# Patient Record
Sex: Female | Born: 1975 | Race: Black or African American | Hispanic: No | Marital: Single | Smoking: Never smoker
Health system: Southern US, Community
[De-identification: ages and names within clinical notes are randomized; demographics above are authoritative.]

## PROBLEM LIST (undated history)

## (undated) ENCOUNTER — Inpatient Hospital Stay (HOSPITAL_COMMUNITY): Payer: Self-pay

## (undated) DIAGNOSIS — I10 Essential (primary) hypertension: Secondary | ICD-10-CM

## (undated) DIAGNOSIS — G4700 Insomnia, unspecified: Secondary | ICD-10-CM

## (undated) DIAGNOSIS — T8859XA Other complications of anesthesia, initial encounter: Secondary | ICD-10-CM

## (undated) DIAGNOSIS — D219 Benign neoplasm of connective and other soft tissue, unspecified: Secondary | ICD-10-CM

## (undated) DIAGNOSIS — B999 Unspecified infectious disease: Secondary | ICD-10-CM

## (undated) DIAGNOSIS — T4145XA Adverse effect of unspecified anesthetic, initial encounter: Secondary | ICD-10-CM

---

## 1997-04-25 ENCOUNTER — Inpatient Hospital Stay (HOSPITAL_COMMUNITY): Admission: AD | Admit: 1997-04-25 | Discharge: 1997-04-25 | Payer: Self-pay | Admitting: Obstetrics & Gynecology

## 1997-06-18 ENCOUNTER — Inpatient Hospital Stay (HOSPITAL_COMMUNITY): Admission: AD | Admit: 1997-06-18 | Discharge: 1997-06-18 | Payer: Self-pay | Admitting: Obstetrics & Gynecology

## 1997-06-20 ENCOUNTER — Ambulatory Visit (HOSPITAL_COMMUNITY): Admission: RE | Admit: 1997-06-20 | Discharge: 1997-06-20 | Payer: Self-pay | Admitting: Obstetrics

## 1997-09-02 ENCOUNTER — Ambulatory Visit (HOSPITAL_COMMUNITY): Admission: RE | Admit: 1997-09-02 | Discharge: 1997-09-02 | Payer: Self-pay | Admitting: Obstetrics

## 1997-10-11 ENCOUNTER — Inpatient Hospital Stay (HOSPITAL_COMMUNITY): Admission: AD | Admit: 1997-10-11 | Discharge: 1997-10-11 | Payer: Self-pay | Admitting: *Deleted

## 1997-10-23 ENCOUNTER — Encounter: Admission: RE | Admit: 1997-10-23 | Discharge: 1998-01-21 | Payer: Self-pay | Admitting: Obstetrics & Gynecology

## 1997-10-29 ENCOUNTER — Inpatient Hospital Stay (HOSPITAL_COMMUNITY): Admission: AD | Admit: 1997-10-29 | Discharge: 1997-10-31 | Payer: Self-pay | Admitting: *Deleted

## 1997-10-31 ENCOUNTER — Inpatient Hospital Stay (HOSPITAL_COMMUNITY): Admission: AD | Admit: 1997-10-31 | Discharge: 1997-11-02 | Payer: Self-pay | Admitting: Obstetrics

## 1998-09-27 ENCOUNTER — Encounter: Payer: Self-pay | Admitting: Emergency Medicine

## 1998-09-27 ENCOUNTER — Emergency Department (HOSPITAL_COMMUNITY): Admission: EM | Admit: 1998-09-27 | Discharge: 1998-09-27 | Payer: Self-pay | Admitting: Emergency Medicine

## 1998-10-03 ENCOUNTER — Encounter: Admission: RE | Admit: 1998-10-03 | Discharge: 1998-11-10 | Payer: Self-pay | Admitting: Family Medicine

## 1999-01-27 ENCOUNTER — Emergency Department (HOSPITAL_COMMUNITY): Admission: EM | Admit: 1999-01-27 | Discharge: 1999-01-27 | Payer: Self-pay | Admitting: Emergency Medicine

## 1999-01-27 ENCOUNTER — Encounter: Payer: Self-pay | Admitting: Emergency Medicine

## 1999-05-21 ENCOUNTER — Emergency Department (HOSPITAL_COMMUNITY): Admission: EM | Admit: 1999-05-21 | Discharge: 1999-05-21 | Payer: Self-pay | Admitting: Emergency Medicine

## 2000-06-05 ENCOUNTER — Inpatient Hospital Stay (HOSPITAL_COMMUNITY): Admission: AD | Admit: 2000-06-05 | Discharge: 2000-06-05 | Payer: Self-pay | Admitting: Obstetrics & Gynecology

## 2000-10-26 ENCOUNTER — Inpatient Hospital Stay (HOSPITAL_COMMUNITY): Admission: AD | Admit: 2000-10-26 | Discharge: 2000-10-26 | Payer: Self-pay | Admitting: Obstetrics

## 2000-11-02 ENCOUNTER — Encounter: Admission: RE | Admit: 2000-11-02 | Discharge: 2000-11-02 | Payer: Self-pay | Admitting: Obstetrics & Gynecology

## 2000-11-02 ENCOUNTER — Other Ambulatory Visit: Admission: RE | Admit: 2000-11-02 | Discharge: 2000-11-02 | Payer: Self-pay | Admitting: Obstetrics & Gynecology

## 2000-11-09 ENCOUNTER — Encounter: Admission: RE | Admit: 2000-11-09 | Discharge: 2000-11-09 | Payer: Self-pay | Admitting: Obstetrics & Gynecology

## 2000-11-09 ENCOUNTER — Encounter: Admission: RE | Admit: 2000-11-09 | Discharge: 2001-02-07 | Payer: Self-pay | Admitting: Obstetrics & Gynecology

## 2000-11-11 ENCOUNTER — Ambulatory Visit (HOSPITAL_COMMUNITY): Admission: RE | Admit: 2000-11-11 | Discharge: 2000-11-11 | Payer: Self-pay | Admitting: Obstetrics & Gynecology

## 2000-11-16 ENCOUNTER — Encounter: Admission: RE | Admit: 2000-11-16 | Discharge: 2000-11-16 | Payer: Self-pay | Admitting: Obstetrics & Gynecology

## 2000-11-23 ENCOUNTER — Encounter: Admission: RE | Admit: 2000-11-23 | Discharge: 2000-11-23 | Payer: Self-pay | Admitting: Obstetrics & Gynecology

## 2000-11-30 ENCOUNTER — Encounter: Admission: RE | Admit: 2000-11-30 | Discharge: 2000-11-30 | Payer: Self-pay | Admitting: Obstetrics & Gynecology

## 2000-12-14 ENCOUNTER — Encounter: Admission: RE | Admit: 2000-12-14 | Discharge: 2000-12-14 | Payer: Self-pay | Admitting: Obstetrics & Gynecology

## 2000-12-21 ENCOUNTER — Encounter: Admission: RE | Admit: 2000-12-21 | Discharge: 2000-12-21 | Payer: Self-pay | Admitting: Obstetrics & Gynecology

## 2001-01-04 ENCOUNTER — Encounter: Admission: RE | Admit: 2001-01-04 | Discharge: 2001-01-04 | Payer: Self-pay | Admitting: Obstetrics & Gynecology

## 2001-01-09 ENCOUNTER — Ambulatory Visit (HOSPITAL_COMMUNITY): Admission: RE | Admit: 2001-01-09 | Discharge: 2001-01-09 | Payer: Self-pay | Admitting: Obstetrics & Gynecology

## 2001-01-25 HISTORY — PX: TUBAL LIGATION: SHX77

## 2001-01-25 HISTORY — PX: ENDOMETRIAL ABLATION: SHX621

## 2001-02-01 ENCOUNTER — Encounter: Admission: RE | Admit: 2001-02-01 | Discharge: 2001-02-01 | Payer: Self-pay | Admitting: Obstetrics & Gynecology

## 2001-02-15 ENCOUNTER — Encounter: Admission: RE | Admit: 2001-02-15 | Discharge: 2001-02-15 | Payer: Self-pay | Admitting: Obstetrics & Gynecology

## 2001-02-22 ENCOUNTER — Inpatient Hospital Stay (HOSPITAL_COMMUNITY): Admission: AD | Admit: 2001-02-22 | Discharge: 2001-02-22 | Payer: Self-pay | Admitting: *Deleted

## 2001-03-16 ENCOUNTER — Encounter: Admission: RE | Admit: 2001-03-16 | Discharge: 2001-03-16 | Payer: Self-pay | Admitting: *Deleted

## 2001-03-18 ENCOUNTER — Inpatient Hospital Stay (HOSPITAL_COMMUNITY): Admission: AD | Admit: 2001-03-18 | Discharge: 2001-03-18 | Payer: Self-pay | Admitting: *Deleted

## 2001-03-29 ENCOUNTER — Encounter: Admission: RE | Admit: 2001-03-29 | Discharge: 2001-03-29 | Payer: Self-pay | Admitting: *Deleted

## 2001-03-31 ENCOUNTER — Ambulatory Visit (HOSPITAL_COMMUNITY): Admission: RE | Admit: 2001-03-31 | Discharge: 2001-03-31 | Payer: Self-pay | Admitting: *Deleted

## 2001-04-06 ENCOUNTER — Encounter: Admission: RE | Admit: 2001-04-06 | Discharge: 2001-04-06 | Payer: Self-pay | Admitting: *Deleted

## 2001-04-13 ENCOUNTER — Encounter: Admission: RE | Admit: 2001-04-13 | Discharge: 2001-04-13 | Payer: Self-pay | Admitting: *Deleted

## 2001-04-19 ENCOUNTER — Encounter: Admission: RE | Admit: 2001-04-19 | Discharge: 2001-04-19 | Payer: Self-pay | Admitting: *Deleted

## 2001-04-26 ENCOUNTER — Encounter: Admission: RE | Admit: 2001-04-26 | Discharge: 2001-04-26 | Payer: Self-pay | Admitting: *Deleted

## 2001-05-01 ENCOUNTER — Ambulatory Visit (HOSPITAL_COMMUNITY): Admission: RE | Admit: 2001-05-01 | Discharge: 2001-05-01 | Payer: Self-pay | Admitting: *Deleted

## 2001-05-03 ENCOUNTER — Encounter: Admission: RE | Admit: 2001-05-03 | Discharge: 2001-05-03 | Payer: Self-pay | Admitting: *Deleted

## 2001-05-05 ENCOUNTER — Encounter (HOSPITAL_COMMUNITY): Admission: RE | Admit: 2001-05-05 | Discharge: 2001-05-20 | Payer: Self-pay | Admitting: *Deleted

## 2001-05-10 ENCOUNTER — Encounter: Admission: RE | Admit: 2001-05-10 | Discharge: 2001-05-10 | Payer: Self-pay | Admitting: *Deleted

## 2001-05-15 ENCOUNTER — Inpatient Hospital Stay (HOSPITAL_COMMUNITY): Admission: AD | Admit: 2001-05-15 | Discharge: 2001-05-15 | Payer: Self-pay | Admitting: Obstetrics and Gynecology

## 2001-05-17 ENCOUNTER — Encounter: Admission: RE | Admit: 2001-05-17 | Discharge: 2001-05-17 | Payer: Self-pay | Admitting: *Deleted

## 2001-05-20 ENCOUNTER — Inpatient Hospital Stay (HOSPITAL_COMMUNITY): Admission: AD | Admit: 2001-05-20 | Discharge: 2001-05-23 | Payer: Self-pay | Admitting: *Deleted

## 2001-05-20 ENCOUNTER — Encounter (INDEPENDENT_AMBULATORY_CARE_PROVIDER_SITE_OTHER): Payer: Self-pay | Admitting: Specialist

## 2001-05-25 ENCOUNTER — Inpatient Hospital Stay (HOSPITAL_COMMUNITY): Admission: AD | Admit: 2001-05-25 | Discharge: 2001-05-25 | Payer: Self-pay | Admitting: *Deleted

## 2002-04-04 ENCOUNTER — Inpatient Hospital Stay (HOSPITAL_COMMUNITY): Admission: AD | Admit: 2002-04-04 | Discharge: 2002-04-04 | Payer: Self-pay | Admitting: Obstetrics and Gynecology

## 2002-04-10 ENCOUNTER — Encounter: Admission: RE | Admit: 2002-04-10 | Discharge: 2002-04-10 | Payer: Self-pay | Admitting: Obstetrics and Gynecology

## 2002-09-14 ENCOUNTER — Encounter: Admission: RE | Admit: 2002-09-14 | Discharge: 2002-09-14 | Payer: Self-pay | Admitting: Family Medicine

## 2003-04-16 ENCOUNTER — Encounter: Admission: RE | Admit: 2003-04-16 | Discharge: 2003-04-16 | Payer: Self-pay | Admitting: Obstetrics and Gynecology

## 2003-10-03 ENCOUNTER — Inpatient Hospital Stay (HOSPITAL_COMMUNITY): Admission: AD | Admit: 2003-10-03 | Discharge: 2003-10-03 | Payer: Self-pay | Admitting: *Deleted

## 2004-02-04 ENCOUNTER — Ambulatory Visit: Payer: Self-pay | Admitting: Internal Medicine

## 2004-03-31 ENCOUNTER — Ambulatory Visit: Payer: Self-pay | Admitting: Obstetrics & Gynecology

## 2004-06-03 ENCOUNTER — Ambulatory Visit: Payer: Self-pay | Admitting: Internal Medicine

## 2004-10-05 ENCOUNTER — Emergency Department (HOSPITAL_COMMUNITY): Admission: EM | Admit: 2004-10-05 | Discharge: 2004-10-05 | Payer: Self-pay | Admitting: Emergency Medicine

## 2004-11-04 ENCOUNTER — Inpatient Hospital Stay (HOSPITAL_COMMUNITY): Admission: AD | Admit: 2004-11-04 | Discharge: 2004-11-04 | Payer: Self-pay | Admitting: Obstetrics and Gynecology

## 2005-11-09 ENCOUNTER — Inpatient Hospital Stay (HOSPITAL_COMMUNITY): Admission: AD | Admit: 2005-11-09 | Discharge: 2005-11-09 | Payer: Self-pay | Admitting: Obstetrics and Gynecology

## 2006-01-03 ENCOUNTER — Ambulatory Visit (HOSPITAL_COMMUNITY): Admission: RE | Admit: 2006-01-03 | Discharge: 2006-01-03 | Payer: Self-pay | Admitting: Obstetrics & Gynecology

## 2006-10-14 ENCOUNTER — Emergency Department (HOSPITAL_COMMUNITY): Admission: EM | Admit: 2006-10-14 | Discharge: 2006-10-14 | Payer: Self-pay | Admitting: Emergency Medicine

## 2006-11-01 ENCOUNTER — Ambulatory Visit (HOSPITAL_COMMUNITY): Admission: RE | Admit: 2006-11-01 | Discharge: 2006-11-01 | Payer: Self-pay | Admitting: Obstetrics & Gynecology

## 2006-12-01 ENCOUNTER — Ambulatory Visit (HOSPITAL_COMMUNITY): Admission: RE | Admit: 2006-12-01 | Discharge: 2006-12-01 | Payer: Self-pay | Admitting: Obstetrics & Gynecology

## 2006-12-01 ENCOUNTER — Encounter: Payer: Self-pay | Admitting: Obstetrics & Gynecology

## 2007-03-24 ENCOUNTER — Emergency Department (HOSPITAL_COMMUNITY): Admission: EM | Admit: 2007-03-24 | Discharge: 2007-03-24 | Payer: Self-pay | Admitting: Emergency Medicine

## 2009-04-10 ENCOUNTER — Emergency Department (HOSPITAL_COMMUNITY): Admission: EM | Admit: 2009-04-10 | Discharge: 2009-04-10 | Payer: Self-pay | Admitting: Emergency Medicine

## 2010-01-20 ENCOUNTER — Emergency Department (HOSPITAL_COMMUNITY)
Admission: EM | Admit: 2010-01-20 | Discharge: 2010-01-21 | Payer: Self-pay | Source: Home / Self Care | Admitting: Emergency Medicine

## 2010-02-15 ENCOUNTER — Encounter: Payer: Self-pay | Admitting: Obstetrics & Gynecology

## 2010-04-06 LAB — BASIC METABOLIC PANEL
BUN: 9 mg/dL (ref 6–23)
Calcium: 8.8 mg/dL (ref 8.4–10.5)
Glucose, Bld: 302 mg/dL — ABNORMAL HIGH (ref 70–99)
Potassium: 4.3 mEq/L (ref 3.5–5.1)
Sodium: 133 mEq/L — ABNORMAL LOW (ref 135–145)

## 2010-04-06 LAB — URINALYSIS, ROUTINE W REFLEX MICROSCOPIC
Glucose, UA: >1000 mg/dL — AB
Protein, ur: NEGATIVE mg/dL

## 2010-04-06 LAB — DIFFERENTIAL
Basophils Relative: 0 % (ref 0–1)
Eosinophils Absolute: 0 10*3/uL (ref 0.0–0.7)
Eosinophils Relative: 0 % (ref 0–5)
Lymphocytes Relative: 11 % — ABNORMAL LOW (ref 12–46)
Lymphs Abs: 1.2 10*3/uL (ref 0.7–4.0)
Neutrophils Relative %: 81 % — ABNORMAL HIGH (ref 43–77)

## 2010-04-06 LAB — GLUCOSE, CAPILLARY: Glucose-Capillary: 284 mg/dL — ABNORMAL HIGH (ref 70–99)

## 2010-04-06 LAB — BLOOD GAS, VENOUS
Bicarbonate: 25.4 mEq/L — ABNORMAL HIGH (ref 20.0–24.0)
FIO2: 0.21 %
O2 Saturation: 90.8 %
Patient temperature: 98.6
TCO2: 22.3 mmol/L (ref 0–100)
pCO2, Ven: 40.3 mmHg — ABNORMAL LOW (ref 45.0–50.0)
pH, Ven: 7.417 — ABNORMAL HIGH (ref 7.250–7.300)
pO2, Ven: 59.3 mmHg — ABNORMAL HIGH (ref 30.0–45.0)

## 2010-04-06 LAB — CBC
Hemoglobin: 14.1 g/dL (ref 12.0–15.0)
MCH: 27.6 pg (ref 26.0–34.0)
Platelets: 261 10*3/uL (ref 150–400)
WBC: 10.7 10*3/uL — ABNORMAL HIGH (ref 4.0–10.5)

## 2010-04-06 LAB — URINE MICROSCOPIC-ADD ON

## 2010-06-09 NOTE — Op Note (Signed)
[name], Melanie Luna                 ACCOUNT NO.:  [id number]   MEDICAL RECORD NO.:  [id number]          PATIENT TYPE:  AMB   LOCATION:  SDC                           [facility]ILITY:  [facility]   PHYSICIAN:  Roseanna Rainbow, M.D.DATE OF BIRTH:  12-28-1975   DATE OF PROCEDURE:  12/01/2006  DATE OF DISCHARGE:                               OPERATIVE REPORT   PREOPERATIVE DIAGNOSIS:  Abnormal uterine bleeding.   POSTOPERATIVE DIAGNOSIS:  Abnormal uterine bleeding.   PROCEDURE:  Dilatation and curettage, diagnostic hysteroscopy, NovaSure  endometrial ablation.   SURGEON:  Roseanna Rainbow, M.D.   ANESTHESIA:  General endotracheal anesthesia, paracervical block.   IV FLUIDS:  As per anesthesiology.   COMPLICATIONS:  None.   ESTIMATED BLOOD LOSS:  Minimal.   FINDINGS:  Upon hysteroscopic survey of endometrial cavity, there were  no discrete lesions noted.   DESCRIPTION OF PROCEDURE:  The patient was taken to the operating room  with an IV running.  She was given general anesthesia and then placed in  the dorsal lithotomy position and prepped and draped in the usual  sterile fashion.  A weighted speculum and Sims retractor were then  placed into the vagina.  The anterior lip of the cervix was grasped with  a single tooth tenaculum.  4 mL of 1% lidocaine were injected at 4 and 7  o'clock to produce a paracervical block.  The Hegar dilator was then  used to measure the length of the cervical canal and this was measured  at 4 cm.  The uterus was then sounded to 8 cm.  The cervix was then  dilated with [facility] dilators.  A diagnostic hysteroscopy was then  performed with the above findings noted.  A sharp curettage was then  performed.  The NovaSure device was then advanced into the uterine  fundus and seated.  The intracavitary integrity test was then performed  and passed.  The ablation cycle was then started and completed.  The  device was then removed.  The hysteroscope was then  reintroduced into  the uterus and appropriate ablation of the cavity was noted.  The  hysteroscope was then removed.  The single tooth tenaculum was then  removed with minimal bleeding noted from the cervix.  At the close of  the procedure, the instrument and pack counts were said to be correct  x2.  The patient is taken to the PACU awake in stable condition.      Roseanna Rainbow, M.D.  Electronically Signed    LAJ/MEDQ  D:  12/01/2006  T:  12/01/2006  Job:  [id number]

## 2010-06-12 NOTE — Op Note (Signed)
[facility] [place]  Patient:    Melanie Luna, Melanie Luna Visit Number: [id number] MRN: [id number]          Service Type: ANT Location: MATC Attending Physician:  Enid Cutter Dictated by:   Ed Blalock. Burnadette Peter, M.D. Proc. Date: 05/20/01 Admit Date:  05/05/2001 Discharge Date: 05/20/2001   CC:         Ed Blalock. Burnadette Peter, M.D.   Operative Report  DATE OF BIRTH:                Melanie Luna 1, 1977  PREOPERATIVE DIAGNOSES:       1. Intrauterine pregnancy at 37-1/7 weeks.                               2. Active labor.                               3. Persistent occipitoposterior.                               4. Failure to descend.                               5. Multiparous, desires permanent sterilization.  POSTOPERATIVE DIAGNOSES:      1. Intrauterine pregnancy at 37-1/7 weeks.                               2. Active labor.                               3. Persistent occipitoposterior.                               4. Failure to descend.                               5. Multiparous, desires permanent sterilization.  OPERATION:                    Low transverse cesarean section and bilateral tubal ligation.  SURGEON:                      Conni Elliot, M.D.  ASSISTANTS:                   1. Ed Blalock. Burnadette Peter, M.D.                               2. Clement Husbands, M.D.  ANESTHESIA:                   Epidural.  FLUIDS:                       1500 cc of lactated Ringers.  ESTIMATED BLOOD LOSS:         1000 cc.  URINE OUTPUT:                 350 cc of clear urine  at completion of th procedure.  INDICATIONS:                  This is a 35 year old G5, P2-0-2-2 at 37-1/7 weeks with failure to descend and persistent occipitoposterior.  The patient also desires BTL and has signed paper work on the chart.  The patients maximum dilation was complete at 10 cm.  FINDINGS:                     Female infant with Apgars of 6 and 9, weight 6 pounds 13 ounces, cord pH  7.23.  Normal uterus, ovaries and tubes.  DESCRIPTION OF PROCEDURE:     The patient was taken to the operating room where epidural anesthesia was found to be adequate.  She was then prepped and draped in the normal sterile fashion and normal supine position.  A skin incision was then made with the scalpel and then carried down to the underlying layer of fascia with Bovie.  The fascia was incised in the midline sharply, and the incision was extended laterally with Mayo scissors.  The superior aspect of the fascial incision was then grasped with a Kocher clamp, elevated and the underlying rectus muscle dissected out bluntly.  Attention was then turned to the inferior aspect of the incision which in a similar fashion was then grasped, tented up with the Kocher clamp and the rectus muscle dissected off bluntly.  The rectus muscles were then separated in the midline.  The peritoneum was identified, tented up and entered sharply with the Metzenbaum scissors.  The peritoneal incision was then extended superiorly and inferiorly with good visualization of the bladder.  The bladder blade was then inserted and a vesicouterine peritoneum grasped with the pickups and entered sharply with Metzenbaum scissors.  The incision was then extended laterally and the bladder flap created digitally.  The bladder blade was then reinserted in the lower uterine segment incising the transverse fascia with the scalpel.  Uterine incision was then extended laterally with stretching.  The bladder blade was removed, and the infants head was delivered atraumatically with assistance from below by the staff nurse.  The nose and mouth were suctioned with the bulb suction and the cored clamped and cut.  The infant was handed off to the awaiting pediatrician. Cord gases were sent and are as noted above.  The placenta was then removed manually, the uterus excised and exteriorized and cleared of all clots and debris.  The  uterine incision was then repaired with 1-0 chromic in a running locked fashion.  A second layer of the same suture was used to obtain excellent hemostasis.  The bladder flap was repaired with 2-0 Vicryl in a running stitch.  Attention was then turned to the tubes bilaterally.  The right tube was grasped with a Babcock, and 3 cc segment ligated using a 2-0SH needle and then a free tie of chromic.  The segment was then excised with Metzenbaum scissors. The pedicle was noted to be hemostatic, and attention was then turned to the left tube.  A similar segment was ligated and excised in a similar fashion. Good hemostasis was noted bilaterally.  The uterus was then returned to the abdomen.  The gutter was cleared of all clots, and the peritoneum was closed with 0 Vicryl.  The fascia was then reapproximated with 0 Vicryl in a running fashion.  The skin was closed with staples.  The patient tolerated the procedure well.  Sponge, lap and needle  counts were correct x2.  Clindamycin 900 mg was given at cord clamp, and the patient was taken to the recovery room in good condition. Dictated by:   Ed Blalock. Burnadette Peter, M.D. Attending Physician:  Enid Cutter DD:  05/21/01 TD:  05/21/01 Job: [id number]

## 2010-06-12 NOTE — Group Therapy Note (Signed)
   NAME:  Melanie Luna, Melanie Luna                           ACCOUNT NO.:  [id number]   MEDICAL RECORD NO.:  [id number]                   PATIENT TYPE:  OUT   LOCATION:  [facility]                           FACILITY:  [facility]   PHYSICIAN:  Tinnie Gens, MD                     DATE OF BIRTH:  1975/04/14   DATE OF SERVICE:  09/14/2002                                    CLINIC NOTE   HISTORY OF PRESENT ILLNESS:  The patient is a 35 year old gravida 5, para 3-  0-2-3 who is status post BTL in Bra[place] of 2003 who is here for annual  examination and Pap smear.  She denies any problems at this time.  Approximately three months ago the patient was seen for abnormal uterine  bleeding, placed on Lo-Ovral.  However, she reports that her monthly cycles  are back to being regular and she has stopped oral contraceptives.  She  denies any vaginal discharge.  She has been in a monogamous relationship.   PHYSICAL EXAMINATION:  VITAL SIGNS:  Pulse 72, blood pressure 174/79, weight  151.  GENERAL:  She is a mildly obese white female in no acute distress.  NECK:  Supple and feels full anteriorly.  However, the thyroid gland is not  prominent.  There is no lymphadenopathy supraclavicular or axillary.  BREASTS:  Benign with everted nipples.  There is no masses or tenderness.  ABDOMEN:  Soft and nontender.  GENITOURINARY:  Normal external female genitalia.  Vagina is well rugated.  Some moderate amount of yellow discharge though it is fairly adherent to the  cervix.  Cervix is visualized without lesion.  There is no cervical motion  tenderness.  The uterus is of normal size, shape, and contour.  The adnexa  are also benign.   IMPRESSION:  1. Annual gynecologic examination.  2. High risk sexual behavior.  This patient wonders if her man is stepping     out on her.   PLAN:  Pap smear today.  GC, Chlamydia, and wet prep.  Follow up as needed.                                               Tinnie Gens, MD    TP/MEDQ   D:  09/14/2002  T:  09/14/2002  Job:  [id number]

## 2010-06-12 NOTE — Discharge Summary (Signed)
[facility]  Patient:    YEXALEN, DEIKE Visit Number: [id number] MRN: [id number]          Service Type: OBS Location: 910A 9145 01 Attending Physician:  Michaelle Copas Dictated by:   Arlis Porta, M.D. Admit Date:  05/20/2001 Discharge Date: 05/23/2001   CC:         [facility]  [facility]  Alma Downs, M.D.   Discharge Summary  DISCHARGE DIAGNOSES: 1. Gravida 5, now para 3-0-2-3. 2. Status post low transverse cesarean section and bilateral tubal ligation on    Dorise 27, 2003, secondary to failure to descend and persistent occiput    posterior presentation.  This was productive for a viable female infant with    Apgars of 6/9.  There were no postoperative complications. 3. Gestational diabetes versus type 2 diabetes.  LABORATORY DATA:              Urinalysis at discharge showed negative nitrites, leukocytes, protein, ketones, glucose.  CBC at discharge showed a white blood cell count of 19.4, hemoglobin 7.5, hematocrit 22.9, and platelet count 145.  RPR negative, GBS negative, gonorrhea and chlamydia negative.  HIV negative, hepatitis B surface antigen negative.  HOSPITAL COURSE:              This is a 35 year old G5, now P3-0-2-3, who presented in active labor.  The patient has had a previous history of gestational diabetes which has been diet controlled.  After about 10 hours of labor, the fetus failed to further descend.  She was, therefore, sent for a C section performed on Annistyn 27, 2003, by Dr. Gavin Potters and Dr. Burnadette Peter.  She also underwent a tubal sterilization.  Estimated blood loss was 1 liter.  This was performed under epidural anesthesia.  This was productive for a viable infant with Apgars 6/9.  Cord gas was 7.23.  There were no postoperative complications.  The patients pain was controlled with ibuprofen, Naprosyn, and Tylenol.  No narcotics were used secondary to an allergy to CODEINE causing  seizures.  The patients blood sugars postoperatively have still been slight elevated to the 130s and 140s.  All the rest of the patients vital signs have been stable, and she has remained afebrile.  The patient plans on breast and bottle feeding.  The female infant has been circumcised.  She was discharged on postoperative day #3 in stable condition.  DISCHARGE MEDICATIONS:        1. Naprosyn 500 mg p.o. b.i.d. p.r.n. pain.                               2. Tylenol over-the-counter p.r.n. pain.                               3. Prenatal vitamins 1 tablet p.o. q.d. if                                  breast feeding.  ACTIVITY:                     Per instruction booklet.  No heavy lifting x 1 month.  Sexual activity per booklet.  DIET:  The patient is to follow a diabetic diet as previously instructed by the diabetes management center.  WOUND CARE:                   Per instruction booklet.  FOLLOWUP:                     [facility] in six weeks.  An appointment has also been made for the patient to be seen at the [facility] on Jun 12, 2001, at 10:15 a.m.  She should call [phone] with questions or concerns.  In the meantime, she should check her blood sugars before each meal and record them and bring them with each of her appointments.  She should return in two days to the MAU to have her staples removed.  An appointment has been made for her infant on Jun 07, 2001, at 10:30 a.m. to see Dr. Loreta Ave. Dictated by:   Arlis Porta, M.D. Attending Physician:  Michaelle Copas DD:  05/23/01 TD:  05/23/01 Job: [id number]

## 2010-06-12 NOTE — Group Therapy Note (Signed)
NAME:  Melanie Luna, Melanie Luna                           ACCOUNT NO.:  [id number]   MEDICAL RECORD NO.:  [id number]                   PATIENT TYPE:  OUT   LOCATION:  [facility]                           FACILITY[facility]  [facility]   PHYSICIAN:  Argentina Donovan, MD                     DATE OF BIRTH:  12-09-1975   DATE OF SERVICE:  04/16/2003                                    CLINIC NOTE   REASON FOR VISIT:  The patient is a 35 year old African-American female who  is here mainly to have a check for GC and chlamydia.  The patient has no  medical problem but is worried that her boyfriend may be cheating on her.  She denied any discharge, fever, abdominal pain.  However, she is getting  ready to move in with her boyfriend and also get married this coming Melanie Luna.  She wants to be checked for HIV and possible STDs just to rule out the  possibilities of her getting infection.   MEDICATIONS:  She is currently not on any medications.   Her last menstrual period is April 04, 2003.  Her last Pap smear was in  August 2004.  She is allergic to PENICILLIN and CODEINE.   REVIEW OF SYSTEMS:  Essentially negative.   PHYSICAL EXAMINATION:  VITAL SIGNS:  She is afebrile with a blood pressure  of 133/90, pulse of 81, her weight is 150.7.  GENERAL:  She is alert, oriented, no acute distress.  CHEST:  Clear to auscultation.  CARDIOVASCULAR:  Regular rate and rhythm.  ABDOMEN:  Benign.  EXTREMITIES:  Showed no edema, cyanosis, or clubbing.   ASSESSMENT AND PLAN:  Sexually-transmitted disease screening.  The patient  has high-risk sexual exposure.  Although she has been with one man now she  has had multiple partners in the past and she has never used any protection.  We will therefore go ahead and screen her for HIV, GC, and chlamydia and  share the results with the patient once they return.     Melanie Luna, M.D.                Argentina Donovan, MD    MLG/MEDQ  D:  04/16/2003  T:  04/16/2003  Job:  [id number]

## 2010-10-19 LAB — URINE CULTURE

## 2010-10-19 LAB — URINALYSIS, ROUTINE W REFLEX MICROSCOPIC
Ketones, ur: NEGATIVE
Nitrite: POSITIVE — AB
pH: 5.5

## 2010-10-19 LAB — URINE MICROSCOPIC-ADD ON

## 2010-11-03 LAB — BASIC METABOLIC PANEL
GFR calc non Af Amer: >60
Potassium: 3.9
Sodium: 136

## 2010-11-03 LAB — CBC
Hemoglobin: 12.1
Platelets: 314
RDW: 12.8
WBC: 7.2

## 2010-11-05 LAB — WET PREP, GENITAL
Clue Cells Wet Prep HPF POC: NONE SEEN
Trich, Wet Prep: NONE SEEN
Yeast Wet Prep HPF POC: NONE SEEN

## 2010-11-26 ENCOUNTER — Inpatient Hospital Stay (HOSPITAL_COMMUNITY)
Admission: RE | Admit: 2010-11-26 | Discharge: 2010-11-26 | Disposition: A | Discharge status: Home / Self Care | Payer: Medicaid Other | Source: Ambulatory Visit | Attending: Emergency Medicine | Admitting: Emergency Medicine

## 2010-11-30 ENCOUNTER — Encounter: Payer: Self-pay | Admitting: Emergency Medicine

## 2010-11-30 ENCOUNTER — Emergency Department (HOSPITAL_COMMUNITY)
Admission: EM | Admit: 2010-11-30 | Discharge: 2010-11-30 | Disposition: A | Discharge status: Home / Self Care | Payer: Medicaid Other | Attending: Emergency Medicine | Admitting: Emergency Medicine

## 2010-11-30 DIAGNOSIS — M79609 Pain in unspecified limb: Secondary | ICD-10-CM | POA: Insufficient documentation

## 2010-11-30 DIAGNOSIS — L03019 Cellulitis of unspecified finger: Secondary | ICD-10-CM | POA: Insufficient documentation

## 2010-11-30 DIAGNOSIS — IMO0001 Reserved for inherently not codable concepts without codable children: Secondary | ICD-10-CM

## 2010-11-30 MED ORDER — HYDROCODONE-ACETAMINOPHEN 5-500 MG PO TABS: 1.0000 | ORAL_TABLET | Freq: Four times a day (QID) | ORAL | Status: AC | PRN

## 2010-11-30 NOTE — ED Notes (Signed)
Pt c/o right 3rd digit pain and and swelling x 1 week; pt sts started on doxycycline on Nov 1st; pt sts not improving

## 2010-11-30 NOTE — ED Notes (Signed)
Pt states that she had a hang nail on her right middle finger two weeks ago and it has gotten worse ever since.

## 2010-11-30 NOTE — ED Provider Notes (Signed)
History     CSN: [id number] Arrival date & time: 11/30/2010  8:25 AM   First MD Initiated Contact with Patient 11/30/10 0920      Chief Complaint  Patient presents with  . Finger Injury    (Consider location/radiation/quality/duration/timing/severity/associated sxs/prior treatment) Patient is a 35 y.o. female presenting with hand pain. The history is provided by the patient.  Hand Pain This is a new problem. The current episode started in the past 7 days. The problem occurs constantly. The problem has been gradually worsening. Pertinent negatives include no chills, fever or joint swelling. The symptoms are aggravated by bending (palpation).  Pt states she picked a hang nail about a week ago, symptoms started since then. Reports swelling, tenderenss to the right distal 3rd finger. Went to her PCP who started her on doxycyclin 5 days ago. No improvement since then. Denies any other complaints.  Past Medical History  Diagnosis Date  . Diabetes mellitus     History reviewed. No pertinent past surgical history.  History reviewed. No pertinent family history.  History  Substance Use Topics  . Smoking status: Never Smoker   . Smokeless tobacco: Not on file  . Alcohol Use: No    OB History    Grav Para Term Preterm Abortions TAB SAB Ect Mult Living                  Review of Systems  Constitutional: Negative for fever and chills.  Respiratory: Negative.   Cardiovascular: Negative.   Musculoskeletal: Negative for joint swelling.       Finger pain, swelling  Skin:       Erythema of the finger    Allergies  Codeine  Home Medications   Current Outpatient Rx  Name Route Sig Dispense Refill  . INSULIN GLARGINE 100 UNIT/ML [place] SOLN Subcutaneous Inject 40 Units into the skin at bedtime. Takes during the day as needed for high cbg       BP 134/95  Pulse 88  Temp(Src) 97.6 F (36.4 C) (Oral)  Resp 16  SpO2 96%  Physical Exam  Constitutional: She is oriented to  person, place, and time. She appears well-developed and well-nourished. No distress.  Neck: Neck supple.  Cardiovascular: Normal rate, regular rhythm and normal heart sounds.   Pulmonary/Chest: Effort normal and breath sounds normal.  Neurological: She is alert and oriented to person, place, and time.  Skin: Skin is warm and dry. There is erythema.       Paronychia of the right 3rd finger. Tender to palpation  Psychiatric: She has a normal mood and affect.    ED Course  Drain paronychia Date/Time: 11/30/2010 9:41 AM Performed by: Jaynie Crumble A Authorized by: Jasmine Awe Consent: Verbal consent obtained. Written consent not obtained. Risks and benefits: risks, benefits and alternatives were discussed Consent given by: patient Required items: required blood products, implants, devices, and special equipment available Patient identity confirmed: verbally with patient Time out: Immediately prior to procedure a "time out" was called to verify the correct patient, procedure, equipment, support staff and site/side marked as required. Preparation: Patient was prepped and draped in the usual sterile fashion. Local anesthesia used: no Patient sedated: no Patient tolerance: Patient tolerated the procedure well with no immediate complications.      MDM          Lottie Mussel, PA 11/30/10 1155

## 2010-11-30 NOTE — ED Provider Notes (Signed)
Medical screening examination/treatment/procedure(s) were performed by non-physician practitioner and as supervising physician I was immediately available for consultation/collaboration.  Jasmine Awe, MD 11/30/10 [phone]

## 2011-04-26 ENCOUNTER — Emergency Department (HOSPITAL_COMMUNITY): Payer: Medicaid Other

## 2011-04-26 ENCOUNTER — Encounter (HOSPITAL_COMMUNITY): Payer: Self-pay | Admitting: *Deleted

## 2011-04-26 ENCOUNTER — Emergency Department (HOSPITAL_COMMUNITY)
Admission: EM | Admit: 2011-04-26 | Discharge: 2011-04-26 | Disposition: A | Discharge status: Home / Self Care | Payer: Medicaid Other | Attending: Emergency Medicine | Admitting: Emergency Medicine

## 2011-04-26 DIAGNOSIS — R739 Hyperglycemia, unspecified: Secondary | ICD-10-CM

## 2011-04-26 DIAGNOSIS — R32 Unspecified urinary incontinence: Secondary | ICD-10-CM | POA: Insufficient documentation

## 2011-04-26 DIAGNOSIS — R42 Dizziness and giddiness: Secondary | ICD-10-CM | POA: Insufficient documentation

## 2011-04-26 DIAGNOSIS — R509 Fever, unspecified: Secondary | ICD-10-CM | POA: Insufficient documentation

## 2011-04-26 DIAGNOSIS — R350 Frequency of micturition: Secondary | ICD-10-CM | POA: Insufficient documentation

## 2011-04-26 DIAGNOSIS — Z794 Long term (current) use of insulin: Secondary | ICD-10-CM | POA: Insufficient documentation

## 2011-04-26 DIAGNOSIS — R112 Nausea with vomiting, unspecified: Secondary | ICD-10-CM | POA: Insufficient documentation

## 2011-04-26 DIAGNOSIS — R5381 Other malaise: Secondary | ICD-10-CM | POA: Insufficient documentation

## 2011-04-26 DIAGNOSIS — J3489 Other specified disorders of nose and nasal sinuses: Secondary | ICD-10-CM | POA: Insufficient documentation

## 2011-04-26 DIAGNOSIS — R059 Cough, unspecified: Secondary | ICD-10-CM | POA: Insufficient documentation

## 2011-04-26 DIAGNOSIS — E119 Type 2 diabetes mellitus without complications: Secondary | ICD-10-CM | POA: Insufficient documentation

## 2011-04-26 DIAGNOSIS — R05 Cough: Secondary | ICD-10-CM | POA: Insufficient documentation

## 2011-04-26 DIAGNOSIS — R231 Pallor: Secondary | ICD-10-CM | POA: Insufficient documentation

## 2011-04-26 DIAGNOSIS — B349 Viral infection, unspecified: Secondary | ICD-10-CM

## 2011-04-26 DIAGNOSIS — B9789 Other viral agents as the cause of diseases classified elsewhere: Secondary | ICD-10-CM | POA: Insufficient documentation

## 2011-04-26 DIAGNOSIS — R3915 Urgency of urination: Secondary | ICD-10-CM | POA: Insufficient documentation

## 2011-04-26 DIAGNOSIS — IMO0001 Reserved for inherently not codable concepts without codable children: Secondary | ICD-10-CM | POA: Insufficient documentation

## 2011-04-26 DIAGNOSIS — Z79899 Other long term (current) drug therapy: Secondary | ICD-10-CM | POA: Insufficient documentation

## 2011-04-26 LAB — URINALYSIS, ROUTINE W REFLEX MICROSCOPIC
Glucose, UA: 500 mg/dL — AB
Hgb urine dipstick: NEGATIVE
Specific Gravity, Urine: 1.033 — ABNORMAL HIGH (ref 1.005–1.030)
Urobilinogen, UA: 2 mg/dL — ABNORMAL HIGH (ref 0.0–1.0)
pH: 6 (ref 5.0–8.0)

## 2011-04-26 LAB — BASIC METABOLIC PANEL WITH GFR
BUN: 8 mg/dL (ref 6–23)
CO2: 26 meq/L (ref 19–32)
Calcium: 8.9 mg/dL (ref 8.4–10.5)
Chloride: 96 meq/L (ref 96–112)
Creatinine, Ser: 0.47 mg/dL — ABNORMAL LOW (ref 0.50–1.10)
GFR calc Af Amer: >90 mL/min
GFR calc non Af Amer: >90 mL/min
Glucose, Bld: 260 mg/dL — ABNORMAL HIGH (ref 70–99)
Potassium: 3.9 meq/L (ref 3.5–5.1)
Sodium: 132 meq/L — ABNORMAL LOW (ref 135–145)

## 2011-04-26 LAB — CBC
HCT: 39.1 % (ref 36.0–46.0)
Hemoglobin: 12.9 g/dL (ref 12.0–15.0)
MCH: 26.7 pg (ref 26.0–34.0)
MCHC: 33 g/dL (ref 30.0–36.0)
MCV: 80.8 fL (ref 78.0–100.0)
Platelets: 262 10*3/uL (ref 150–400)
RBC: 4.84 MIL/uL (ref 3.87–5.11)
RDW: 12 % (ref 11.5–15.5)
WBC: 6.3 10*3/uL (ref 4.0–10.5)

## 2011-04-26 LAB — GLUCOSE, CAPILLARY
Glucose-Capillary: 250 mg/dL — ABNORMAL HIGH (ref 70–99)
Glucose-Capillary: 248 mg/dL — ABNORMAL HIGH (ref 70–99)

## 2011-04-26 LAB — PREGNANCY, URINE: Preg Test, Ur: NEGATIVE

## 2011-04-26 MED ORDER — WHITE PETROLATUM GEL
Status: AC
  Administered 2011-04-26: 04:00:00
  Filled 2011-04-26: qty 5

## 2011-04-26 MED ORDER — SODIUM CHLORIDE 0.9 % IV BOLUS (SEPSIS)
1000.0000 mL | Freq: Once | INTRAVENOUS | Status: AC
  Administered 2011-04-26: 1000 mL via INTRAVENOUS

## 2011-04-26 NOTE — ED Notes (Signed)
Went in to introduce myself to patient and obtain a cbg reading. Pt begin explaining how she pressed the call button and the nurse did not come. Writer asked if there was something she could help with. She asked if she could have some oxygen. Writer stated she needed to check O2 levels before asking nurse. O2 level at 97% on Room Air. Pt then went on to express she did not want to have the nurse Eustace Pen; and went on to ask what all she would have to do with her care. Writer explained that she would be the one to give medications to her and assess her. Pt explained that she didn't feel as if Eustace Pen gave enough care as a nurse. Writer offered to let pt talk with charge nurse. She rejected offer and then stated she was thinking about leaving. Writer came out and discussed with nurse.

## 2011-04-26 NOTE — ED Provider Notes (Signed)
History     CSN: [id number]  Arrival date & time 04/26/11  0142   First MD Initiated Contact with Patient 04/26/11 0300      Chief Complaint  Patient presents with  . Fever  . Nausea    (Consider location/radiation/quality/duration/timing/severity/associated sxs/prior treatment) HPI Comments: Patient has had low-grade fevers up to 101, nausea and vomiting for the past several days.  Her blood sugars have been exceptionally high for her between 250 and 300 despite her using her Lantus.  She states she has no appetite and has not been eating she has been incontinent of urine twice  Patient is a 36 y.o. female presenting with fever. The history is provided by the patient.  Fever Primary symptoms of the febrile illness include fever, fatigue and cough. Primary symptoms do not include headaches or dysuria. The current episode started 2 days ago. This is a new problem.    Past Medical History  Diagnosis Date  . Diabetes mellitus     History reviewed. No pertinent past surgical history.  No family history on file.  History  Substance Use Topics  . Smoking status: Never Smoker   . Smokeless tobacco: Not on file  . Alcohol Use: No    OB History    Grav Para Term Preterm Abortions TAB SAB Ect Mult Living                  Review of Systems  Constitutional: Positive for fever and fatigue.  HENT: Negative for rhinorrhea.   Respiratory: Positive for cough.   Genitourinary: Positive for urgency and frequency. Negative for dysuria.  Neurological: Positive for dizziness and weakness. Negative for headaches.    Allergies  Codeine  Home Medications   Current Outpatient Rx  Name Route Sig Dispense Refill  . GUAIFENESIN ER 600 MG PO TB12 Oral Take 1,200 mg by mouth 2 (two) times daily.    . GUAIFENESIN 100 MG/5ML PO SYRP Oral Take 200 mg by mouth 3 (three) times daily as needed.    . INSULIN GLARGINE 100 UNIT/ML [place] SOLN Subcutaneous Inject 50 Units into the skin at bedtime.  Takes during the day as needed for high cbg      BP 129/81  Pulse 99  Temp(Src) 98.5 F (36.9 C) (Oral)  Resp 17  SpO2 97%  Physical Exam  Constitutional: She is oriented to person, place, and time. She appears well-developed and well-nourished.  HENT:  Head: Normocephalic.  Eyes: Pupils are equal, round, and reactive to light.  Neck: Normal range of motion.  Cardiovascular: Normal rate.   Pulmonary/Chest: Effort normal.  Abdominal: Soft. She exhibits no distension. There is no tenderness.  Musculoskeletal: She exhibits no edema and no tenderness.  Neurological: She is alert and oriented to person, place, and time.  Skin: Skin is warm. There is pallor.    ED Course  Procedures (including critical care time)  Labs Reviewed  GLUCOSE, CAPILLARY - Abnormal; Notable for the following:    Glucose-Capillary 250 (*)    All other components within normal limits  URINALYSIS, ROUTINE W REFLEX MICROSCOPIC - Abnormal; Notable for the following:    Specific Gravity, Urine 1.033 (*)    Glucose, UA 500 (*)    Ketones, ur TRACE (*)    Urobilinogen, UA 2.0 (*)    All other components within normal limits  BASIC METABOLIC PANEL - Abnormal; Notable for the following:    Sodium 132 (*)    Glucose, Bld 260 (*)  Creatinine, Ser 0.47 (*)    All other components within normal limits  PREGNANCY, URINE  CBC   Dg Chest 2 View  04/26/2011  *RADIOLOGY REPORT*  Clinical Data: Body aches, cough, congestion, nausea, vomiting  CHEST - 2 VIEW  Comparison: 04/10/2009  Findings: Shallow inspiration.  Heart size and pulmonary vascularity are normal for technique.  No focal airspace consolidation in the lungs.  No blunting of costophrenic angles. No pneumothorax.  IMPRESSION: No evidence of active pulmonary disease.  Original Report Authenticated By: Marlon Pel, M.D.     1. Viral illness   2. Hypoglycemia       MDM  hyperglycemia        Arman Filter, NP 04/26/11 [id number]  Arman Filter, NP 04/26/11 [phone]

## 2011-04-26 NOTE — ED Notes (Signed)
Pt states"  You know nerves in your brain are suppose to let you know when you drink something, but her mouth only opens a little bit "

## 2011-04-26 NOTE — ED Provider Notes (Signed)
Medical screening examination/treatment/procedure(s) were performed by non-physician practitioner and as supervising physician I was immediately available for consultation/collaboration.   Faithe Ariola, MD 04/26/11 0742 

## 2011-04-26 NOTE — ED Notes (Signed)
Patient transported to X-ray 

## 2011-04-26 NOTE — ED Notes (Signed)
Pt took mucinex dm at 1300 and tussin for cough at 1300,  And took Lantus at 1100 50 units

## 2011-04-26 NOTE — ED Notes (Signed)
Pt CBG is 248.

## 2011-04-26 NOTE — Discharge Instructions (Signed)
Cough, Adult  A cough is a reflex that helps clear your throat and airways. It can help heal the body or may be a reaction to an irritated airway. A cough may only last 2 or 3 weeks (acute) or may last more than 8 weeks (chronic).  CAUSES Acute cough:  Viral or bacterial infections.  Chronic cough:  Infections.   Allergies.   Asthma.   Post-nasal drip.   Smoking.   Heartburn or acid reflux.   Some medicines.   Chronic lung problems (COPD).   Cancer.  SYMPTOMS   Cough.   Fever.   Chest pain.   Increased breathing rate.   High-pitched whistling sound when breathing (wheezing).   Colored mucus that you cough up (sputum).  TREATMENT   A bacterial cough may be treated with antibiotic medicine.   A viral cough must run its course and will not respond to antibiotics.   Your caregiver may recommend other treatments if you have a chronic cough.  HOME CARE INSTRUCTIONS   Only take over-the-counter or prescription medicines for pain, discomfort, or fever as directed by your caregiver. Use cough suppressants only as directed by your caregiver.   Use a cold steam vaporizer or humidifier in your bedroom or home to help loosen secretions.   Sleep in a semi-upright position if your cough is worse at night.   Rest as needed.   Stop smoking if you smoke.  SEEK IMMEDIATE MEDICAL CARE IF:   You have pus in your sputum.   Your cough starts to worsen.   You cannot control your cough with suppressants and are losing sleep.   You begin coughing up blood.   You have difficulty breathing.   You develop pain which is getting worse or is uncontrolled with medicine.   You have a fever.  MAKE SURE YOU:   Understand these instructions.   Will watch your condition.   Will get help right away if you are not doing well or get worse.  Document Released: 07/10/2010 Document Revised: 12/31/2010 Document Reviewed: 07/10/2010 [facility] Patient Information 2012 [place],  [place]. Your labs, chest xray and urine are normal

## 2011-04-26 NOTE — ED Notes (Signed)
Pt. States she has has a intermittent fever for 5 days, productive cough with yellow -green tinged phlegm, and nausea. She states she has not had an appetite. Last emesis was on  Friday. She denies diarrhea and SOB however admits to blurred vision and dizziness. She takes Lantus for Diabetes and also OTC Mucinex  And Tussin.

## 2011-06-17 ENCOUNTER — Emergency Department (HOSPITAL_COMMUNITY): Payer: No Typology Code available for payment source

## 2011-06-17 ENCOUNTER — Encounter (HOSPITAL_COMMUNITY): Payer: Self-pay | Admitting: Emergency Medicine

## 2011-06-17 ENCOUNTER — Emergency Department (HOSPITAL_COMMUNITY)
Admission: EM | Admit: 2011-06-17 | Discharge: 2011-06-18 | Disposition: A | Discharge status: Home / Self Care | Payer: No Typology Code available for payment source | Attending: Emergency Medicine | Admitting: Emergency Medicine

## 2011-06-17 DIAGNOSIS — Y9301 Activity, walking, marching and hiking: Secondary | ICD-10-CM | POA: Insufficient documentation

## 2011-06-17 DIAGNOSIS — E119 Type 2 diabetes mellitus without complications: Secondary | ICD-10-CM | POA: Insufficient documentation

## 2011-06-17 DIAGNOSIS — W1789XA Other fall from one level to another, initial encounter: Secondary | ICD-10-CM | POA: Insufficient documentation

## 2011-06-17 DIAGNOSIS — Z794 Long term (current) use of insulin: Secondary | ICD-10-CM | POA: Insufficient documentation

## 2011-06-17 DIAGNOSIS — S39012A Strain of muscle, fascia and tendon of lower back, initial encounter: Secondary | ICD-10-CM

## 2011-06-17 DIAGNOSIS — S6990XA Unspecified injury of unspecified wrist, hand and finger(s), initial encounter: Secondary | ICD-10-CM | POA: Insufficient documentation

## 2011-06-17 DIAGNOSIS — S59909A Unspecified injury of unspecified elbow, initial encounter: Secondary | ICD-10-CM | POA: Insufficient documentation

## 2011-06-17 DIAGNOSIS — S59901A Unspecified injury of right elbow, initial encounter: Secondary | ICD-10-CM

## 2011-06-17 DIAGNOSIS — S335XXA Sprain of ligaments of lumbar spine, initial encounter: Secondary | ICD-10-CM | POA: Insufficient documentation

## 2011-06-17 DIAGNOSIS — Y998 Other external cause status: Secondary | ICD-10-CM | POA: Insufficient documentation

## 2011-06-17 MED ORDER — HYDROCODONE-ACETAMINOPHEN 5-325 MG PO TABS
1.0000 | ORAL_TABLET | Freq: Once | ORAL | Status: AC
  Administered 2011-06-17: 1 via ORAL
  Filled 2011-06-17: qty 1

## 2011-06-17 NOTE — ED Notes (Signed)
Pt had a fall at 1930. She was walking at the frozen food isle at [employer] and some water was coming out of the freezer at the bottom. She slipped and fell at the turn of the corner. She fell on her left leg. She states that she must have twisted and fell, because she also has bruises on her right arm and shoulder. Pain the back and shoulder blade noted as well.

## 2011-06-18 ENCOUNTER — Emergency Department (HOSPITAL_COMMUNITY): Payer: No Typology Code available for payment source

## 2011-06-18 MED ORDER — HYDROCODONE-ACETAMINOPHEN 5-325 MG PO TABS: 1.0000 | ORAL_TABLET | Freq: Once | ORAL | Status: AC

## 2011-06-18 NOTE — Discharge Instructions (Signed)
Lumbosacral Strain Lumbosacral strain is one of the most common causes of back pain. There are many causes of back pain. Most are not serious conditions. CAUSES  Your backbone (spinal column) is made up of 24 main vertebral bodies, the sacrum, and the coccyx. These are held together by muscles and tough, fibrous tissue (ligaments). Nerve roots pass through the openings between the vertebrae. A sudden move or injury to the back may cause injury to, or pressure on, these nerves. This may result in localized back pain or pain movement (radiation) into the buttocks, down the leg, and into the foot. Sharp, shooting pain from the buttock down the back of the leg (sciatica) is frequently associated with a ruptured (herniated) disk. Pain may be caused by muscle spasm alone. Your caregiver can often find the cause of your pain by the details of your symptoms and an exam. In some cases, you may need tests (such as X-rays). Your caregiver will work with you to decide if any tests are needed based on your specific exam. HOME CARE INSTRUCTIONS   Avoid an underactive lifestyle. Active exercise, as directed by your caregiver, is your greatest weapon against back pain.   Avoid hard physical activities (tennis, racquetball, waterskiing) if you are not in proper physical condition for it. This may aggravate or create problems.   If you have a back problem, avoid sports requiring sudden body movements. Swimming and walking are generally safer activities.   Maintain good posture.   Avoid becoming overweight (obese).   Use bed rest for only the most extreme, sudden (acute) episode. Your caregiver will help you determine how much bed rest is necessary.   For acute conditions, you may put ice on the injured area.   Put ice in a plastic bag.   Place a towel between your skin and the bag.   Leave the ice on for 15 to 20 minutes at a time, every 2 hours, or as needed.   After you are improved and more active, it  may help to apply heat for 30 minutes before activities.  See your caregiver if you are having pain that lasts longer than expected. Your caregiver can advise appropriate exercises or therapy if needed. With conditioning, most back problems can be avoided. SEEK IMMEDIATE MEDICAL CARE IF:   You have numbness, tingling, weakness, or problems with the use of your arms or legs.   You experience severe back pain not relieved with medicines.   There is a change in bowel or bladder control.   You have increasing pain in any area of the body, including your belly (abdomen).   You notice shortness of breath, dizziness, or feel faint.   You feel sick to your stomach (nauseous), are throwing up (vomiting), or become sweaty.   You notice discoloration of your toes or legs, or your feet get very cold.   Your back pain is getting worse.   You have a fever.  MAKE SURE YOU:   Understand these instructions.   Will watch your condition.   Will get help right away if you are not doing well or get worse.  Document Released: 10/21/2004 Document Revised: 12/31/2010 Document Reviewed: 04/12/2008 [facility] Patient Information 2012 [place], [place].  Take the medication as directed for more severe pain - ice and elevated the extremity - call Dr. Sara Chu office on Tuesday and get a follow up appointment with him.  Rest over the weekend.

## 2011-06-18 NOTE — ED Provider Notes (Signed)
History     CSN: [id number]  Arrival date & time 06/17/11  2035   First MD Initiated Contact with Patient 06/17/11 2235      Chief Complaint  Patient presents with  . Fall    (Consider location/radiation/quality/duration/timing/severity/associated sxs/prior treatment) HPI Comments: Patient here after slipping on something wet on the floor at [employer] earlier in the evening - she states that she landed on her left hip and then right elbow and wrist - hematoma noted to right forearm.  Also complains of back pain, right shoulder and wrist pain - no deformity - no decrease in strength.  Patient is a 36 y.o. female presenting with fall. The history is provided by the patient. No language interpreter was used.  Fall The accident occurred 1 to 2 hours ago. The fall occurred while walking. She fell from a height of 3 to 5 ft. She landed on a hard floor. There was no blood loss. The point of impact was the left hip, right wrist and right shoulder. The pain is present in the left hip, right wrist and right shoulder. The pain is at a severity of 7/10. The pain is moderate. She was ambulatory at the scene. There was no entrapment after the fall. There was no drug use involved in the accident. There was no alcohol use involved in the accident. Pertinent negatives include no visual change, no fever, no numbness, no abdominal pain, no bowel incontinence, no nausea, no vomiting, no hematuria, no headaches, no hearing loss, no loss of consciousness and no tingling. The symptoms are aggravated by activity. She has tried nothing for the symptoms. The treatment provided no relief.    Past Medical History  Diagnosis Date  . Diabetes mellitus     History reviewed. No pertinent past surgical history.  No family history on file.  History  Substance Use Topics  . Smoking status: Never Smoker   . Smokeless tobacco: Not on file  . Alcohol Use: No    OB History    Grav Para Term Preterm Abortions TAB  SAB Ect Mult Living                  Review of Systems  Constitutional: Negative for fever.  Gastrointestinal: Negative for nausea, vomiting, abdominal pain and bowel incontinence.  Genitourinary: Negative for hematuria.  Musculoskeletal: Positive for back pain, joint swelling and arthralgias.  Neurological: Negative for tingling, loss of consciousness, numbness and headaches.  All other systems reviewed and are negative.    Allergies  Codeine  Home Medications   Current Outpatient Rx  Name Route Sig Dispense Refill  . INSULIN GLARGINE 100 UNIT/ML [place] SOLN Subcutaneous Inject 50 Units into the skin at bedtime. Takes during the day as needed for high cbg      BP 135/105  Pulse 92  Temp 97.6 F (36.4 C)  SpO2 98%  Physical Exam  Nursing note and vitals reviewed. Constitutional: She is oriented to person, place, and time. She appears well-developed and well-nourished. No distress.  HENT:  Head: Normocephalic and atraumatic.  Right Ear: External ear normal.  Left Ear: External ear normal.  Nose: Nose normal.  Mouth/Throat: Oropharynx is clear and moist. No oropharyngeal exudate.  Eyes: Conjunctivae are normal. Pupils are equal, round, and reactive to light. No scleral icterus.  Neck: Normal range of motion. Neck supple. No spinous process tenderness and no muscular tenderness present.  Cardiovascular: Normal rate, regular rhythm and normal heart sounds.  Exam reveals no gallop  and no friction rub.   No murmur heard. Pulmonary/Chest: Effort normal and breath sounds normal. No respiratory distress. She has no wheezes. She has no rales. She exhibits no tenderness.  Abdominal: Soft. Bowel sounds are normal. She exhibits no distension. There is no tenderness.  Musculoskeletal:       Right shoulder: She exhibits tenderness and bony tenderness. She exhibits normal range of motion, no swelling, no deformity, no spasm and normal pulse.       Right elbow: She exhibits decreased range  of motion and swelling. She exhibits no deformity. tenderness found. Olecranon process tenderness noted.       Right wrist: She exhibits tenderness, bony tenderness and swelling. She exhibits normal range of motion, no effusion and no deformity.       Lumbar back: She exhibits tenderness. She exhibits normal range of motion, no bony tenderness, no swelling, no edema and no deformity.  Lymphadenopathy:    She has no cervical adenopathy.  Neurological: She is alert and oriented to person, place, and time. No cranial nerve deficit. She exhibits normal muscle tone.       Normal gait  Skin: Skin is warm and dry. No rash noted. No erythema. No pallor.  Psychiatric: She has a normal mood and affect. Her behavior is normal. Judgment and thought content normal.    ED Course  Procedures (including critical care time)  Labs Reviewed - No data to display Dg Lumbar Spine Complete  06/17/2011  *RADIOLOGY REPORT*  Clinical Data: Larey Seat.  Low back pain.  LUMBAR SPINE - COMPLETE 4+ VIEW  Comparison: Lumbar spine x-rays 04/10/2009.  Findings: Five non-rib bearing lumbar vertebrae with anatomic alignment.  No fractures.  Slight straightening of the usual lumbar lordosis.  Well-preserved disc spaces.  No pars defects.  No significant facet arthropathy.  Visualized sacroiliac joints intact.  IMPRESSION: Straightening of the usual lumbar lordosis which may reflect positioning and/or spasm.  Otherwise normal examination.  Original Report Authenticated By: Arnell Sieving, M.D.   Dg Elbow Complete Right  06/17/2011  *RADIOLOGY REPORT*  Clinical Data: Larey Seat.  Right elbow pain.  RIGHT ELBOW - COMPLETE 3+ VIEW  Comparison: None.  Findings: No visible acute fracture.  Well-preserved joint spaces. Positive posterior fat pad sign.  IMPRESSION: Posterior fat pad indicating a joint effusion or hemarthrosis.  No visible acute fracture, however.  Original Report Authenticated By: Arnell Sieving, M.D.   Dg Forearm  Right  06/17/2011  *RADIOLOGY REPORT*  Clinical Data: Larey Seat.  Right forearm pain.  RIGHT FOREARM - 2 VIEW  Comparison: Right elbow x-rays obtained concurrently.  Findings: No acute fractures involving the radius or ulna.  Well- preserved bone mineral density.  No intrinsic osseous abnormality. Linear lucency involving the triquetral bone of the wrist on the lateral image.  IMPRESSION: No fractures involving the radius or ulna.  Possible fracture involving the triquetral bone of the wrist; is the patient point tender dorsally?  If so, dedicated wrist x-rays would be suggested.  Original Report Authenticated By: Arnell Sieving, M.D.     Elbow injury Lumbar strain    MDM  Patient here after slipping and falling at the grocery store - x-rays without fracture but posterior fat pad present on elbow film, because of this and the possibility of an occult radial head fracture, patient placed in posterior splint and sling - will refer the patient to Dr. Yisroel Ramming with orthopedics and offer pain control at this time.  Izola Price [place], [place] 06/18/11 [phone]

## 2011-06-18 NOTE — ED Provider Notes (Signed)
Medical screening examination/treatment/procedure(s) were performed by non-physician practitioner and as supervising physician I was immediately available for consultation/collaboration.   Brigitta Pricer, MD 06/18/11 0402 

## 2011-08-05 ENCOUNTER — Other Ambulatory Visit: Payer: Self-pay | Admitting: Sports Medicine

## 2011-08-05 DIAGNOSIS — M25539 Pain in unspecified wrist: Secondary | ICD-10-CM

## 2011-08-05 DIAGNOSIS — Z8781 Personal history of (healed) traumatic fracture: Secondary | ICD-10-CM

## 2011-08-12 ENCOUNTER — Ambulatory Visit
Admission: RE | Admit: 2011-08-12 | Discharge: 2011-08-12 | Disposition: A | Discharge status: Home / Self Care | Payer: Medicaid Other | Source: Ambulatory Visit | Attending: Sports Medicine | Admitting: Sports Medicine

## 2011-08-12 DIAGNOSIS — M25539 Pain in unspecified wrist: Secondary | ICD-10-CM

## 2011-08-12 DIAGNOSIS — Z8781 Personal history of (healed) traumatic fracture: Secondary | ICD-10-CM

## 2011-08-12 MED ORDER — IOHEXOL 180 MG/ML  SOLN
3.0000 mL | Freq: Once | INTRAMUSCULAR | Status: AC | PRN
  Administered 2011-08-12: 3 mL via INTRA_ARTICULAR

## 2012-03-16 ENCOUNTER — Emergency Department (HOSPITAL_COMMUNITY)
Admission: EM | Admit: 2012-03-16 | Discharge: 2012-03-17 | Disposition: A | Discharge status: Home / Self Care | Payer: Medicaid Other | Attending: Emergency Medicine | Admitting: Emergency Medicine

## 2012-03-16 ENCOUNTER — Encounter (HOSPITAL_COMMUNITY): Payer: Self-pay | Admitting: Emergency Medicine

## 2012-03-16 DIAGNOSIS — G5711 Meralgia paresthetica, right lower limb: Secondary | ICD-10-CM

## 2012-03-16 DIAGNOSIS — G5710 Meralgia paresthetica, unspecified lower limb: Secondary | ICD-10-CM | POA: Insufficient documentation

## 2012-03-16 DIAGNOSIS — E119 Type 2 diabetes mellitus without complications: Secondary | ICD-10-CM | POA: Insufficient documentation

## 2012-03-16 DIAGNOSIS — M5430 Sciatica, unspecified side: Secondary | ICD-10-CM | POA: Insufficient documentation

## 2012-03-16 DIAGNOSIS — M5431 Sciatica, right side: Secondary | ICD-10-CM

## 2012-03-16 DIAGNOSIS — Z794 Long term (current) use of insulin: Secondary | ICD-10-CM | POA: Insufficient documentation

## 2012-03-16 LAB — D-DIMER, QUANTITATIVE: D-Dimer, Quant: 0.34 ug/mL-FEU (ref 0.00–0.48)

## 2012-03-16 LAB — POCT I-STAT, CHEM 8
BUN: 13 mg/dL (ref 6–23)
Calcium, Ion: 1.19 mmol/L (ref 1.12–1.23)
Chloride: 99 mEq/L (ref 96–112)
Glucose, Bld: 317 mg/dL — ABNORMAL HIGH (ref 70–99)

## 2012-03-16 MED ORDER — HYDROCODONE-ACETAMINOPHEN 5-325 MG PO TABS
2.0000 | ORAL_TABLET | Freq: Once | ORAL | Status: AC
  Administered 2012-03-16: 2 via ORAL
  Filled 2012-03-16: qty 2

## 2012-03-16 NOTE — ED Notes (Signed)
Pt states 3 days ago she had pain on her entire right side  Pt states she thought it was from carrying her purse on her shoulder so she stopped  Pt states the pain in her right arm and side went away but she has continued to have pain in her right leg  Pt describes the pain as a numbness with a deep aching that wont go away   Pt states the pain is worse with sitting   Pt states she is unable to walk due to the pain

## 2012-03-16 NOTE — ED Provider Notes (Signed)
History     CSN: [id number]  Arrival date & time 03/16/12  2239   First MD Initiated Contact with Patient 03/16/12 2259      No chief complaint on file.   (Consider location/radiation/quality/duration/timing/severity/associated sxs/prior treatment)  HPI Melanie Luna is a 37 y.o. female presenting with anterior and posterior sharp, electric, burning leg pain.  When she walks the pain starts in the posterior mid thigh and radiates down to her heel. She denies any back pain. When she is laying down she is having anterior thigh pain is burning in sensation, the anterior thigh pain as an 8-9/10 and currently the posterior thigh pain is now a 4/10. When she is walking, the posterior pain is worse. Patient does have a history of diabetes. She has no history of back injuries. 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  She denies any cancer treatment, lumbar fractures, recent immobility, history of venous thromboembolic disease, family history of venous thromboembolic disease. Patient has had a tubal ligation but is taking exogenous estrogens. Is a nonsmoker.  Past Medical History  Diagnosis Date  . Diabetes mellitus     Past Surgical History  Procedure Laterality Date  . Cesarean section    . Tubal ligation      Family History  Problem Relation Age of Onset  . Gout Other   . Diabetes Other     History  Substance Use Topics  . Smoking status: Never Smoker   . Smokeless tobacco: Not on file  . Alcohol Use: No    OB History   Grav Para Term Preterm Abortions TAB SAB Ect Mult Living                  Review of Systems At least 10pt or greater review of systems completed and are negative except where specified in the HPI.  Allergies  Codeine  Home Medications   Current Outpatient Rx  Name  Route  Sig  Dispense  Refill  . insulin glargine  (LANTUS) 100 UNIT/ML injection   Subcutaneous   Inject 50 Units into the skin at bedtime. Takes during the day as needed for high cbg           BP 157/110  Pulse 90  Temp(Src) 97.9 F (36.6 C) (Oral)  Resp 20  SpO2 99%  Physical Exam  Nursing notes reviewed.  Electronic medical record reviewed. VITAL SIGNS:   Filed Vitals:   03/16/12 2244  BP: 157/110  Pulse: 90  Temp: 97.9 F (36.6 C)  TempSrc: Oral  Resp: 20  SpO2: 99%   CONSTITUTIONAL: Awake, oriented, appears non-toxic HENT: Atraumatic, normocephalic, oral mucosa pink and moist, airway patent. Nares patent without drainage. External ears normal. EYES: Conjunctiva clear, EOMI, PERRLA NECK: Trachea midline, non-tender, supple CARDIOVASCULAR: Normal heart rate, Normal rhythm, No murmurs, rubs, gallops PULMONARY/CHEST: Clear to auscultation, no rhonchi, wheezes, or rales. Symmetrical breath sounds. Non-tender. ABDOMINAL: Non-distended, soft, non-tender - no rebound or guarding.  BS normal. NEUROLOGIC: Non-focal, moving all four extremities, no gross sensory or motor deficits. EXTREMITIES: No clubbing, cyanosis, or edema. Tenderness to palpation in the posterior thigh, and anterior thigh. No cords palpated. Hamstrings are tender to palpation the right leg - mild hyperesthesia to the anterior and lateral right thigh. SKIN: Warm, Dry, No erythema, No rash  ED Course  Procedures (including critical care time)  Labs Reviewed  GLUCOSE, CAPILLARY - Abnormal; Notable for the following:    Glucose-Capillary 285 (*)    All other components within normal limits  GLUCOSE, CAPILLARY - Abnormal; Notable for the following:    Glucose-Capillary 277 (*)    All other components within normal limits  POCT I-STAT, CHEM 8 - Abnormal; Notable for the following:    Glucose, Bld 317 (*)    All other components within normal limits  D-DIMER, QUANTITATIVE   No results found.   1. Sciatic neuralgia, right   2. Meralgia paraesthetica,  right     MDM  Channell S Milkovich is a 37 y.o. female presenting with peripheral neuralgias, to include meralgia paresthetica as well as some isolated sciatica on the right. There is no associated back pain, no associated trauma. Patient is overweight, she is diabetic her glucose control. I think she's at risk for DVT, well score of 0. Do not think her pain is caused by blood clots in the legs, negative DDimer reduces my suspicions further - patient was concerned about VTE.  Patient does have elevated glucose on BMP of 317, spent about 15 minutes at the bedside discussing ways to reduce her sugars, including better medication compliance, avoidance of fast food and processed food.  Patient also relates a history of wearing an apron it is loaded down with her dressing supplies around her, told her to stop using this is probably worsening her meralgia paresthetica. In addition I n weight is probably playing a role in compression of her nerves as well.  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 Patient is to keep better control of her glucose and follow up with her primary care physician early next week.    Jones Skene, MD 03/18/12 [phone]

## 2012-03-16 NOTE — ED Notes (Signed)
MD at bedside. 

## 2012-03-17 LAB — GLUCOSE, CAPILLARY: Glucose-Capillary: 277 mg/dL — ABNORMAL HIGH (ref 70–99)

## 2012-03-17 MED ORDER — HYDROCODONE-ACETAMINOPHEN 5-325 MG PO TABS: 1.0000 | ORAL_TABLET | Freq: Four times a day (QID) | ORAL | Status: DC | PRN

## 2012-03-17 NOTE — ED Notes (Signed)
CBG 277  

## 2012-09-21 ENCOUNTER — Encounter (HOSPITAL_COMMUNITY): Payer: Self-pay | Admitting: Emergency Medicine

## 2012-09-21 ENCOUNTER — Emergency Department (HOSPITAL_COMMUNITY)
Admission: EM | Admit: 2012-09-21 | Discharge: 2012-09-21 | Disposition: A | Discharge status: Home / Self Care | Payer: Medicaid Other | Attending: Emergency Medicine | Admitting: Emergency Medicine

## 2012-09-21 DIAGNOSIS — R1013 Epigastric pain: Secondary | ICD-10-CM

## 2012-09-21 DIAGNOSIS — Z794 Long term (current) use of insulin: Secondary | ICD-10-CM | POA: Insufficient documentation

## 2012-09-21 DIAGNOSIS — E119 Type 2 diabetes mellitus without complications: Secondary | ICD-10-CM | POA: Insufficient documentation

## 2012-09-21 DIAGNOSIS — K3189 Other diseases of stomach and duodenum: Secondary | ICD-10-CM | POA: Insufficient documentation

## 2012-09-21 MED ORDER — FAMOTIDINE 20 MG PO TABS
20.0000 mg | ORAL_TABLET | Freq: Once | ORAL | Status: AC
  Administered 2012-09-21: 20 mg via ORAL
  Filled 2012-09-21: qty 1

## 2012-09-21 MED ORDER — FAMOTIDINE 20 MG PO TABS: 20.0000 mg | ORAL_TABLET | Freq: Two times a day (BID) | ORAL | Status: DC

## 2012-09-21 MED ORDER — GI COCKTAIL ~~LOC~~
30.0000 mL | Freq: Once | ORAL | Status: AC
  Administered 2012-09-21: 30 mL via ORAL
  Filled 2012-09-21: qty 30

## 2012-09-21 NOTE — ED Notes (Signed)
Pt c/o of upper abdominal pain since Tuesday but has gotten worse overnight. Denies n/v/d

## 2012-09-28 NOTE — ED Provider Notes (Signed)
CSN: [id number]     Arrival date & time 09/21/12  0908 History   First MD Initiated Contact with Patient 09/21/12 0915     Chief Complaint  Patient presents with  . Abdominal Pain   (Consider location/radiation/quality/duration/timing/severity/associated sxs/prior Treatment) HPI  37yF with abdominal pain. Onset yesterday. Upper abdomen and radiates up into chest. Constant. Burning. No appreciable exacerbating or relieving factors. No fever or chills. No cough or sob. No n/v. No urinary complaints. No unusual leg pain or swelling. No hx of similar. Relates to some seafood she ate shortly before symptom onset but other eating same w/o complaint.    Past Medical History  Diagnosis Date  . Diabetes mellitus    Past Surgical History  Procedure Laterality Date  . Cesarean section    . Tubal ligation     Family History  Problem Relation Age of Onset  . Gout Other   . Diabetes Other    History  Substance Use Topics  . Smoking status: Never Smoker   . Smokeless tobacco: Not on file  . Alcohol Use: No   OB History   Grav Para Term Preterm Abortions TAB SAB Ect Mult Living                 Review of Systems  All systems reviewed and negative, other than as noted in HPI.   Allergies  Codeine  Home Medications   Current Outpatient Rx  Name  Route  Sig  Dispense  Refill  . ibuprofen (ADVIL,MOTRIN) 800 MG tablet   Oral   Take 800 mg by mouth every 8 (eight) hours as needed for pain.         Marland Kitchen insulin aspart (NOVOLOG) 100 UNIT/ML injection   Subcutaneous   Inject 25 Units into the skin 3 (three) times daily before meals.          . insulin glargine (LANTUS) 100 UNIT/ML injection   Subcutaneous   Inject 20 Units into the skin 2 (two) times daily.         . famotidine (PEPCID) 20 MG tablet   Oral   Take 1 tablet (20 mg total) by mouth 2 (two) times daily.   60 tablet   0    BP 149/79  Pulse 75  Temp(Src) 98.6 F (37 C) (Oral)  Resp 20  SpO2 97% Physical  Exam  Nursing note and vitals reviewed. Constitutional: She appears well-developed and well-nourished. No distress.  Laying in bed. NAD. Obese.   HENT:  Head: Normocephalic and atraumatic.  Eyes: Conjunctivae are normal. Right eye exhibits no discharge. Left eye exhibits no discharge.  Neck: Neck supple.  Cardiovascular: Normal rate, regular rhythm and normal heart sounds.  Exam reveals no gallop and no friction rub.   No murmur heard. Pulmonary/Chest: Effort normal and breath sounds normal. No respiratory distress.  Abdominal: Soft. She exhibits no distension. There is no tenderness.  Musculoskeletal: She exhibits no edema and no tenderness.  Neurological: She is alert.  Skin: Skin is warm and dry.  Psychiatric: She has a normal mood and affect. Her behavior is normal. Thought content normal.    ED Course  Procedures (including critical care time) Labs Review Labs Reviewed - No data to display Imaging Review No results found.  MDM   1. Dyspepsia    37yF with abdominal pain. Strong suspicion for GI. Benign exam. Very atypical for ACS. Will give trial pepcid.     Raeford Razor, MD 09/28/12 2326

## 2013-04-05 IMAGING — RF DG FLUORO GUIDE NDL PLC/BX
5 series · 5 of 5 positions shown · non-contrast
Comparison: None.

CLINICAL DATA: Right wrist pain.  Triquetral fracture.

FLUORO GUIDED NEEDLE PLACEMENT FOR CONTRAST INJECTION, MR
ARTHROGRAPHY, RIGHT WRIST

[Series 1: arthrogram · 1 of 1 slices shown (1 of 5)]
[im 1/1]
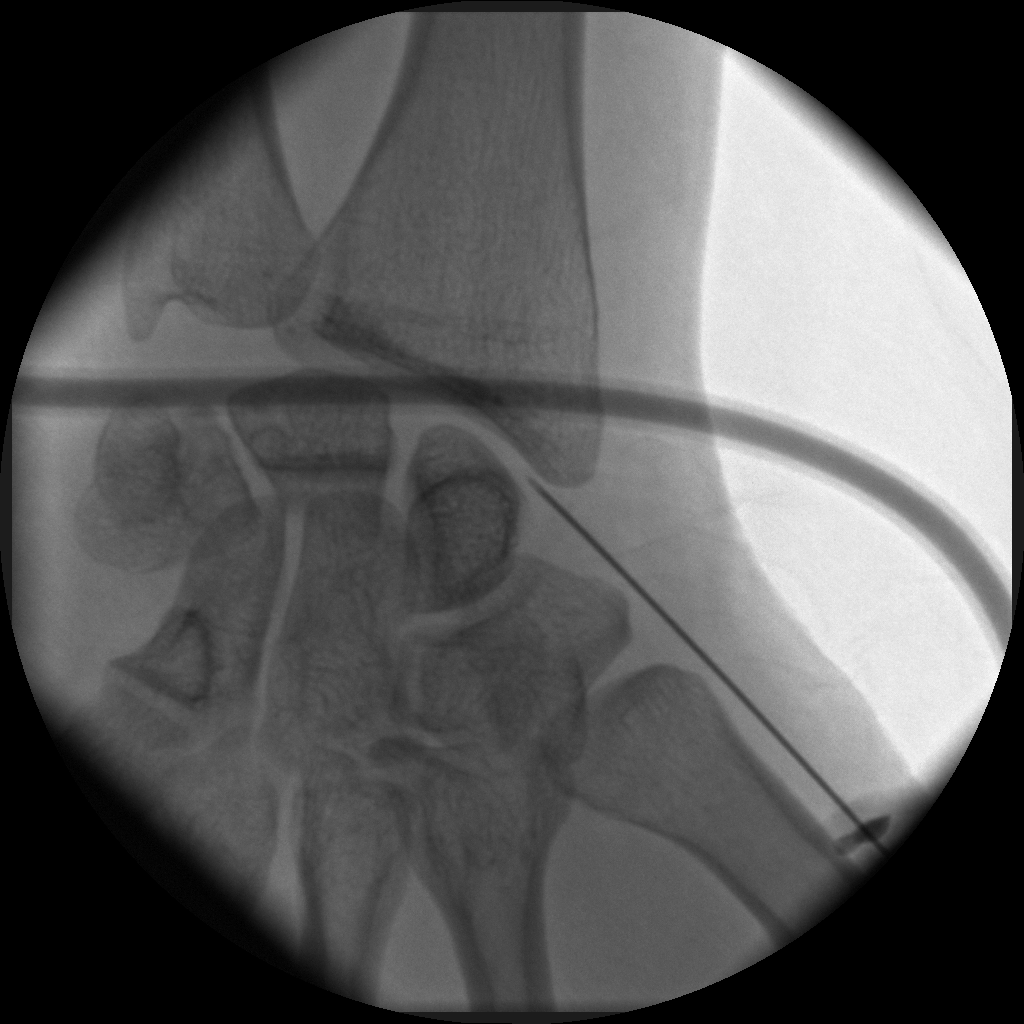

[Series 2: arthrogram · 1 of 1 slices shown (2 of 5)]
[im 1/1]
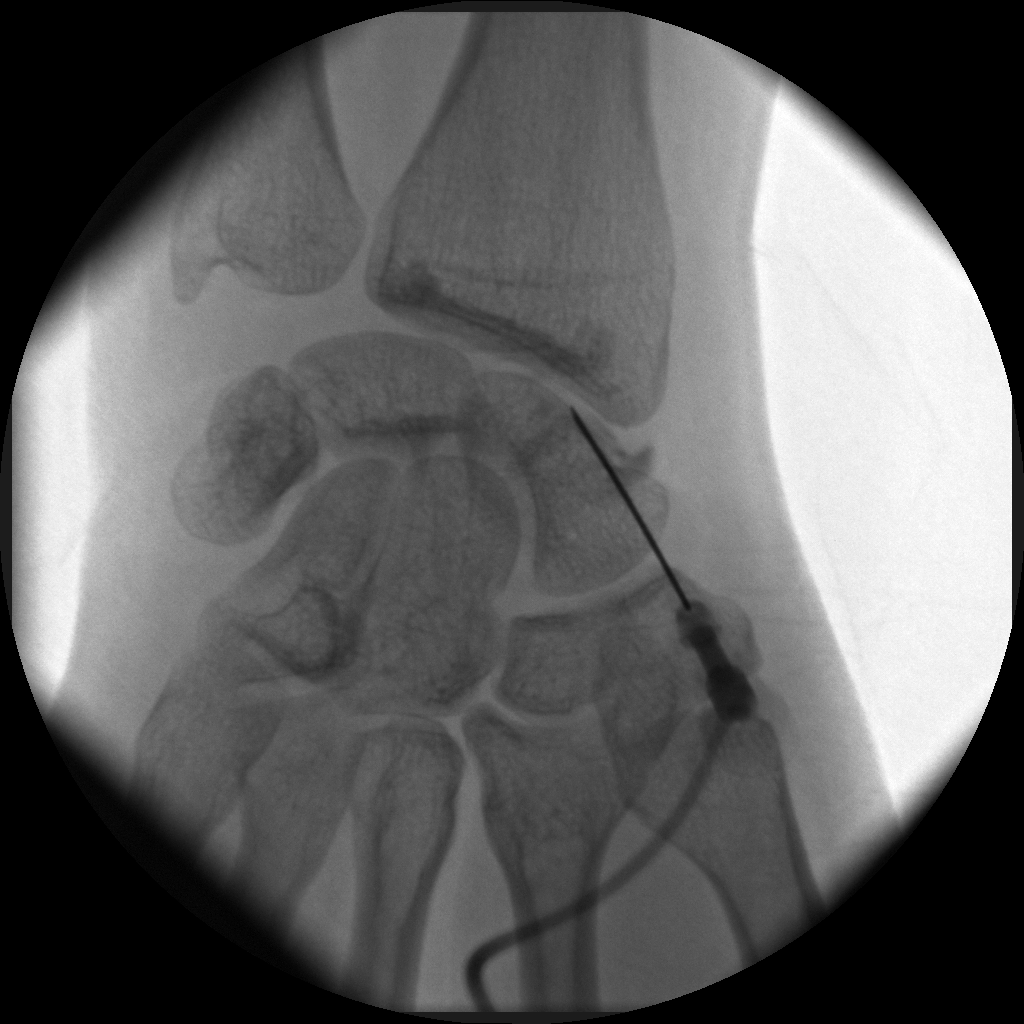

[Series 3: arthrogram · 1 of 1 slices shown (3 of 5)]
[im 1/1]
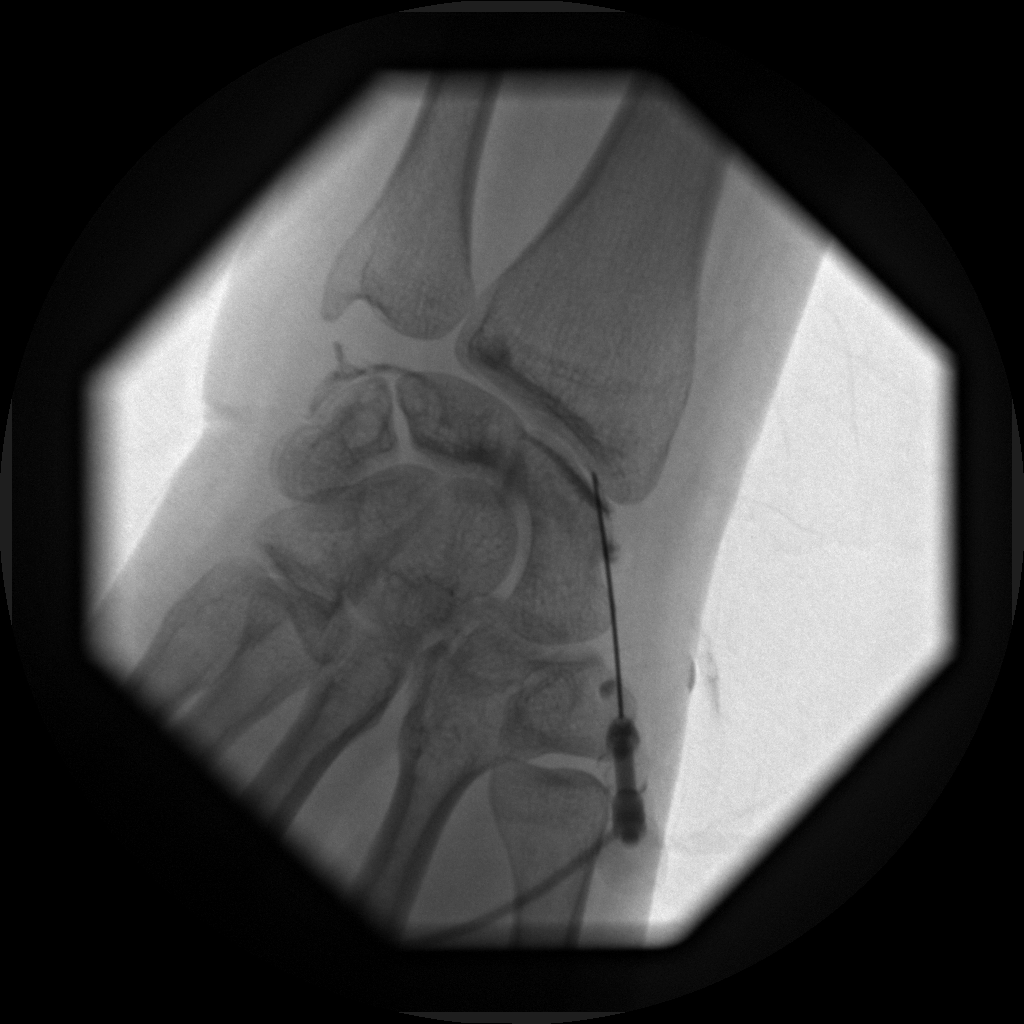

[Series 4: arthrogram · 1 of 1 slices shown (4 of 5)]
[im 1/1]
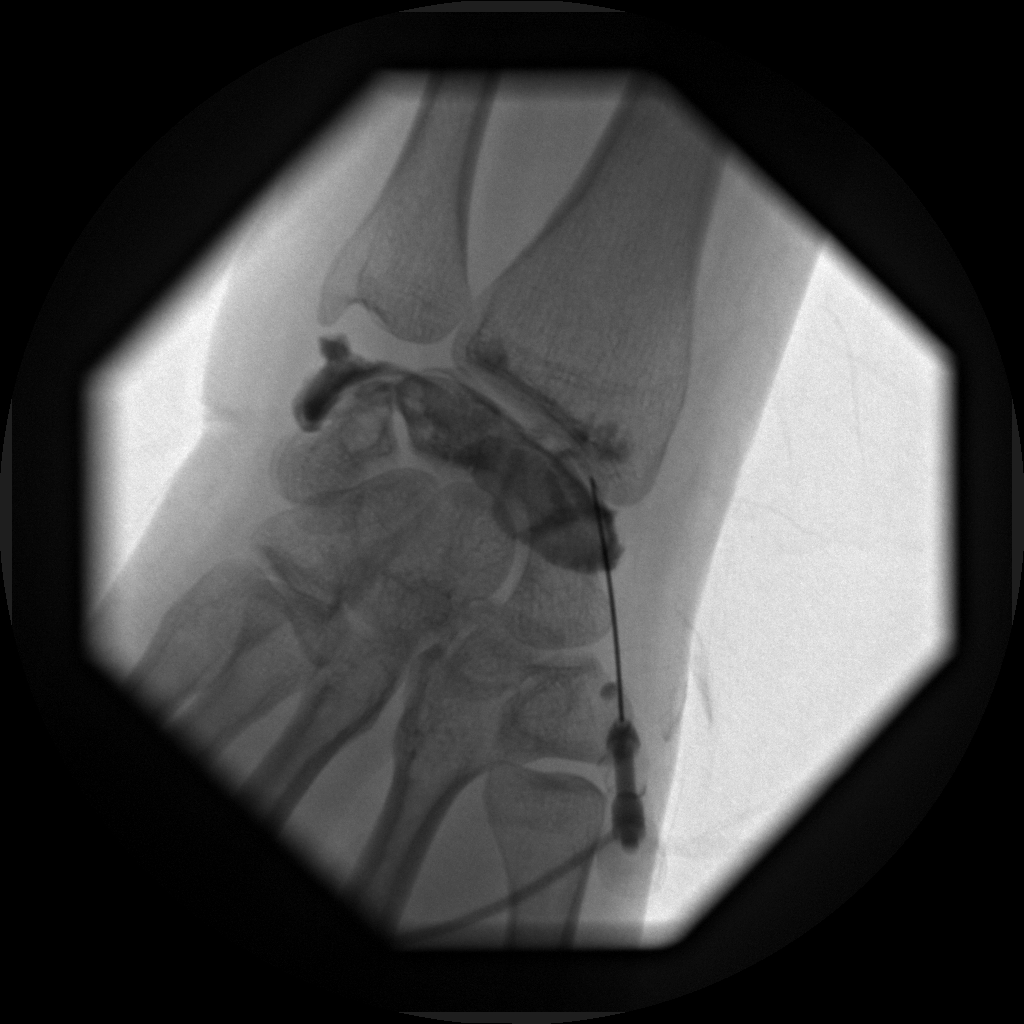

[Series 5: arthrogram · 1 of 1 slices shown (5 of 5)]
[im 1/1]
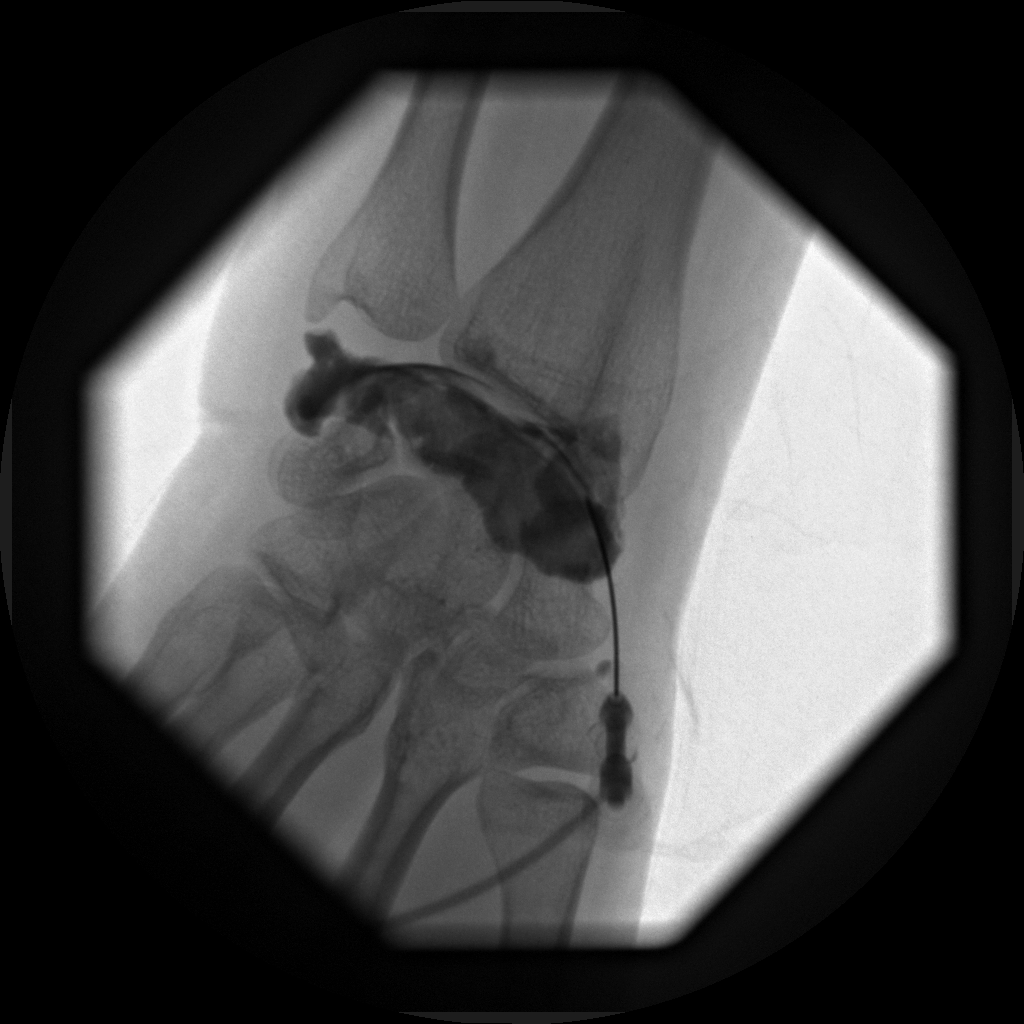

[5 of 5 positions shown; findings below may reference images not displayed]

It is

Procedure: Informed, written, consent was obtained from the
patient, including risk of bleeding, infection, pain, and
neurovascular injury.

The lateral aspect of the wrist was cleansed thoroughly with
Betadine  scrub, and sterile drapes were applied.  A 25 gauge
needle was placed into the lateral aspect of the radiocarpal joint
under direct fluoroscopic guidance.  A mixture of 7.5 mL Omnipaque
180 and 2.5 mL 1% lidocaine was injected into the joint and
monitored fluoroscopically to which a [DATE] dilution of Multihance
(0.05 mL) was added.     The study was performed as a prelude to MR
arthrography, and not as a diagnostic arthrogram.

The needle was withdrawn and a sterile bandage was applied.
Patient was escorted to the MR suite for further imaging.
IMPRESSION: Technically successful fluoroscopically guided needle placement and
contrast injection for MR arthrography,  right wrist.

## 2013-09-12 ENCOUNTER — Emergency Department (HOSPITAL_COMMUNITY)
Admission: EM | Admit: 2013-09-12 | Discharge: 2013-09-12 | Disposition: A | Discharge status: Home / Self Care | Payer: No Typology Code available for payment source | Attending: Emergency Medicine | Admitting: Emergency Medicine

## 2013-09-12 ENCOUNTER — Encounter (HOSPITAL_COMMUNITY): Payer: Self-pay | Admitting: Emergency Medicine

## 2013-09-12 DIAGNOSIS — Y92410 Unspecified street and highway as the place of occurrence of the external cause: Secondary | ICD-10-CM | POA: Insufficient documentation

## 2013-09-12 DIAGNOSIS — S139XXA Sprain of joints and ligaments of unspecified parts of neck, initial encounter: Secondary | ICD-10-CM | POA: Diagnosis not present

## 2013-09-12 DIAGNOSIS — E119 Type 2 diabetes mellitus without complications: Secondary | ICD-10-CM | POA: Diagnosis not present

## 2013-09-12 DIAGNOSIS — S0990XA Unspecified injury of head, initial encounter: Secondary | ICD-10-CM | POA: Insufficient documentation

## 2013-09-12 DIAGNOSIS — R000 Tachycardia, unspecified: Secondary | ICD-10-CM | POA: Diagnosis not present

## 2013-09-12 DIAGNOSIS — S0993XA Unspecified injury of face, initial encounter: Secondary | ICD-10-CM | POA: Insufficient documentation

## 2013-09-12 DIAGNOSIS — Y9389 Activity, other specified: Secondary | ICD-10-CM | POA: Diagnosis not present

## 2013-09-12 DIAGNOSIS — Z794 Long term (current) use of insulin: Secondary | ICD-10-CM | POA: Diagnosis not present

## 2013-09-12 DIAGNOSIS — T148XXA Other injury of unspecified body region, initial encounter: Secondary | ICD-10-CM

## 2013-09-12 DIAGNOSIS — S199XXA Unspecified injury of neck, initial encounter: Secondary | ICD-10-CM

## 2013-09-12 DIAGNOSIS — S161XXA Strain of muscle, fascia and tendon at neck level, initial encounter: Secondary | ICD-10-CM

## 2013-09-12 MED ORDER — IBUPROFEN 800 MG PO TABS: 800.0000 mg | ORAL_TABLET | Freq: Three times a day (TID) | ORAL | Status: DC

## 2013-09-12 MED ORDER — IBUPROFEN 800 MG PO TABS
800.0000 mg | ORAL_TABLET | Freq: Once | ORAL | Status: AC
  Administered 2013-09-12: 800 mg via ORAL
  Filled 2013-09-12: qty 1
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
## 2013-09-13 NOTE — ED Provider Notes (Signed)
Evaluation and management procedures were performed by the PA/NP/CNM under my supervision/collaboration.   Sidney Ace, MD 09/13/13 [phone]

## 2013-12-07 ENCOUNTER — Encounter (HOSPITAL_COMMUNITY): Payer: Self-pay | Admitting: Emergency Medicine

## 2013-12-07 ENCOUNTER — Emergency Department (HOSPITAL_COMMUNITY)
Admission: EM | Admit: 2013-12-07 | Discharge: 2013-12-08 | Disposition: A | Discharge status: Home / Self Care | Payer: Medicaid Other | Attending: Emergency Medicine | Admitting: Emergency Medicine

## 2013-12-07 ENCOUNTER — Emergency Department (HOSPITAL_COMMUNITY): Payer: Medicaid Other

## 2013-12-07 DIAGNOSIS — Z794 Long term (current) use of insulin: Secondary | ICD-10-CM | POA: Insufficient documentation

## 2013-12-07 DIAGNOSIS — R1031 Right lower quadrant pain: Secondary | ICD-10-CM | POA: Diagnosis present

## 2013-12-07 DIAGNOSIS — N7011 Chronic salpingitis: Secondary | ICD-10-CM | POA: Diagnosis not present

## 2013-12-07 DIAGNOSIS — Z791 Long term (current) use of non-steroidal anti-inflammatories (NSAID): Secondary | ICD-10-CM | POA: Insufficient documentation

## 2013-12-07 DIAGNOSIS — Z3202 Encounter for pregnancy test, result negative: Secondary | ICD-10-CM | POA: Diagnosis not present

## 2013-12-07 DIAGNOSIS — A5901 Trichomonal vulvovaginitis: Secondary | ICD-10-CM | POA: Insufficient documentation

## 2013-12-07 DIAGNOSIS — E119 Type 2 diabetes mellitus without complications: Secondary | ICD-10-CM | POA: Insufficient documentation

## 2013-12-07 LAB — CBC WITH DIFFERENTIAL/PLATELET
Basophils Absolute: 0 10*3/uL (ref 0.0–0.1)
Basophils Relative: 0 % (ref 0–1)
Eosinophils Absolute: 0.1 10*3/uL (ref 0.0–0.7)
Eosinophils Relative: 0 % (ref 0–5)
HCT: 38.7 % (ref 36.0–46.0)
HEMOGLOBIN: 12.8 g/dL (ref 12.0–15.0)
LYMPHS ABS: 4.2 10*3/uL — AB (ref 0.7–4.0)
LYMPHS PCT: 38 % (ref 12–46)
MCH: 26.9 pg (ref 26.0–34.0)
MCHC: 33.1 g/dL (ref 30.0–36.0)
MCV: 81.5 fL (ref 78.0–100.0)
MONOS PCT: 8 % (ref 3–12)
Monocytes Absolute: 0.9 10*3/uL (ref 0.1–1.0)
NEUTROS ABS: 6 10*3/uL (ref 1.7–7.7)
NEUTROS PCT: 54 % (ref 43–77)
PLATELETS: 269 10*3/uL (ref 150–400)
RBC: 4.75 MIL/uL (ref 3.87–5.11)
RDW: 12.2 % (ref 11.5–15.5)
WBC: 11.2 10*3/uL — AB (ref 4.0–10.5)

## 2013-12-07 LAB — COMPREHENSIVE METABOLIC PANEL
ALBUMIN: 3.2 g/dL — AB (ref 3.5–5.2)
ALK PHOS: 123 U/L — AB (ref 39–117)
ALT: 15 U/L (ref 0–35)
ANION GAP: 13 (ref 5–15)
AST: 12 U/L (ref 0–37)
BUN: 16 mg/dL (ref 6–23)
CALCIUM: 9.2 mg/dL (ref 8.4–10.5)
CO2: 26 mEq/L (ref 19–32)
Chloride: 96 mEq/L (ref 96–112)
Creatinine, Ser: 0.5 mg/dL (ref 0.50–1.10)
GFR calc non Af Amer: >90 mL/min (ref >90)
GLUCOSE: 269 mg/dL — AB (ref 70–99)
POTASSIUM: 4.3 meq/L (ref 3.7–5.3)
SODIUM: 135 meq/L — AB (ref 137–147)
TOTAL PROTEIN: 7.1 g/dL (ref 6.0–8.3)
Total Bilirubin: <0.2 mg/dL — ABNORMAL LOW (ref 0.3–1.2)

## 2013-12-07 LAB — URINALYSIS, ROUTINE W REFLEX MICROSCOPIC
Bilirubin Urine: NEGATIVE
HGB URINE DIPSTICK: NEGATIVE
Ketones, ur: NEGATIVE mg/dL
LEUKOCYTES UA: NEGATIVE
Nitrite: NEGATIVE
PH: 6 (ref 5.0–8.0)
PROTEIN: NEGATIVE mg/dL
SPECIFIC GRAVITY, URINE: 1.036 — AB (ref 1.005–1.030)
Urobilinogen, UA: 1 mg/dL (ref 0.0–1.0)

## 2013-12-07 LAB — WET PREP, GENITAL: Clue Cells Wet Prep HPF POC: NONE SEEN

## 2013-12-07 LAB — CBG MONITORING, ED: Glucose-Capillary: 220 mg/dL — ABNORMAL HIGH (ref 70–99)

## 2013-12-07 LAB — URINE MICROSCOPIC-ADD ON

## 2013-12-07 LAB — POC URINE PREG, ED: PREG TEST UR: NEGATIVE

## 2013-12-07 LAB — LIPASE, BLOOD: Lipase: 38 U/L (ref 11–59)

## 2013-12-07 MED ORDER — IOHEXOL 300 MG/ML  SOLN
100.0000 mL | Freq: Once | INTRAMUSCULAR | Status: AC | PRN
  Administered 2013-12-07: 100 mL via INTRAVENOUS

## 2013-12-07 MED ORDER — IOHEXOL 300 MG/ML  SOLN
50.0000 mL | Freq: Once | INTRAMUSCULAR | Status: AC | PRN
  Administered 2013-12-07: 100 mL via INTRAVENOUS

## 2013-12-07 NOTE — ED Provider Notes (Signed)
CSN: [id number]     Arrival date & time 12/07/13  1747 History   First MD Initiated Contact with Patient 12/07/13 1828     Chief Complaint  Patient presents with  . RLQ pain      (Consider location/radiation/quality/duration/timing/severity/associated sxs/prior Treatment) HPI Melanie Luna is a(n) 38 y.o. female who presents to the ED with cc of RLQ abdominal pain.  Onset 2 days ago after she had one dose of clindamycin for a dental procedure. She has had associated nausea without vomiting. She denies any fever or chills. Patient denies urinary or vaginal symptoms. Patient has not had a menstrual period in for 10 years because of a uterine ablation surgery. She is a previous history of tubal ligation. Patient has had decreased appetite.  Denies fevers, chills, myalgias, arthralgias. Denies DOE, SOB, chest tightness or pressure, radiation to left arm, jaw or back, or diaphoresis. Denies dysuria, flank pain, suprapubic pain, frequency, urgency, or hematuria. Denies headaches, light headedness, weakness, visual disturbances.   Past Medical History  Diagnosis Date  . Diabetes mellitus    Past Surgical History  Procedure Laterality Date  . Cesarean section    . Tubal ligation     Family History  Problem Relation Age of Onset  . Gout Other   . Diabetes Other    History  Substance Use Topics  . Smoking status: Never Smoker   . Smokeless tobacco: Not on file  . Alcohol Use: No   OB History    No data available     Review of Systems  Ten systems reviewed and are negative for acute change, except as noted in the HPI.    Allergies  Codeine and Penicillins  Home Medications   Prior to Admission medications   Medication Sig Start Date End Date Taking? Authorizing Provider  clindamycin (CLEOCIN) 300 MG capsule Take 300 mg by mouth every 8 (eight) hours.   Yes Historical Provider, MD  insulin aspart (NOVOLOG) 100 UNIT/ML injection Inject 25 Units into the skin 3 (three) times  daily before meals.    Yes Historical Provider, MD  insulin glargine (LANTUS) 100 UNIT/ML injection Inject 20 Units into the skin 2 (two) times daily.   Yes Historical Provider, MD  ibuprofen (ADVIL,MOTRIN) 800 MG tablet Take 1 tablet (800 mg total) by mouth 3 (three) times daily. 09/12/13   Robyn M Hess, PA-C   BP 139/91 mmHg  Pulse 88  Temp(Src) 97.8 F (36.6 C) (Oral)  Resp 20  SpO2 99% Physical Exam  Constitutional: She is oriented to person, place, and time. She appears well-developed and well-nourished. No distress.  HENT:  Head: Normocephalic and atraumatic.  Eyes: Conjunctivae are normal. No scleral icterus.  Neck: Normal range of motion.  Cardiovascular: Normal rate, regular rhythm and normal heart sounds.  Exam reveals no gallop and no friction rub.   No murmur heard. Pulmonary/Chest: Effort normal and breath sounds normal. No respiratory distress.  Abdominal: Soft. Bowel sounds are normal. She exhibits no distension and no mass. There is tenderness (RLQ). There is no rebound and no guarding.  Neurological: She is alert and oriented to person, place, and time.  Skin: Skin is warm and dry. She is not diaphoretic.  Nursing note and vitals reviewed.   ED Course  Procedures (including critical care time) Labs Review Labs Reviewed  COMPREHENSIVE METABOLIC PANEL - Abnormal; Notable for the following:    Sodium 135 (*)    Glucose, Bld 269 (*)    Albumin 3.2 (*)  Alkaline Phosphatase 123 (*)    Total Bilirubin <0.2 (*)    All other components within normal limits  CBC WITH DIFFERENTIAL - Abnormal; Notable for the following:    WBC 11.2 (*)    Lymphs Abs 4.2 (*)    All other components within normal limits  URINALYSIS, ROUTINE W REFLEX MICROSCOPIC - Abnormal; Notable for the following:    Specific Gravity, Urine 1.036 (*)    Glucose, UA >1000 (*)    All other components within normal limits  URINE MICROSCOPIC-ADD ON - Abnormal; Notable for the following:    Squamous  Epithelial / LPF MANY (*)    Bacteria, UA MANY (*)    All other components within normal limits  LIPASE, BLOOD  POC URINE PREG, ED    Imaging Review No results found.   EKG Interpretation None      MDM   Final diagnoses:  Hydrosalpinx    8:08 PM BP 139/91 mmHg  Pulse 88  Temp(Src) 97.8 F (36.6 C) (Oral)  Resp 20  SpO2 99% Patient seen in shared visit with attending physician. Patient with RLQ abdominal pain. CT abdomienl pancing. Slight elevation in her white count.  11:06 PM BP 139/91 mmHg  Pulse 88  Temp(Src) 97.8 F (36.6 C) (Oral)  Resp 20  SpO2 99% Patient with hydrosalpinx on CT scan. I placed call to Dr. Glo Herring who is on-call for faculty practice. He is in surgery and will return my call.  1:00 AM Patient with positive trichomoniasis on her wet prep. 2 g by mouth Flagyl.   I reviewed the images with Dr. Glo Herring. He states he feels that this is probably chronic secondary to her previous surgeries. We'll discharge the patient with Cipro Flagyl. Patient is advised to follow-up Monday with either the faculty practice outpatient clinic at [facility] or her personal OB/GYN. I discussed resistance to return to the emergency Department immediately.  Margarita Mail, PA-C 12/08/13 [place], MD 12/08/13 [phone]

## 2013-12-07 NOTE — ED Notes (Signed)
Pt went to dentist and was given clindamycin on Tuesday. Pt states that wed she stared having RLQ pain and nausea and unable to get out of the bed.  Pt states that she is allergic to PCN and called the doctor back on Thursday when she was still feeling bad. Pt states that she was advised to stop taking medication. Pt also thinks she has bladder infection.

## 2013-12-08 MED ORDER — METRONIDAZOLE 500 MG PO TABS: 500.0000 mg | ORAL_TABLET | Freq: Three times a day (TID) | ORAL | Status: DC

## 2013-12-08 MED ORDER — CIPROFLOXACIN HCL 500 MG PO TABS: 500.0000 mg | ORAL_TABLET | Freq: Two times a day (BID) | ORAL | Status: DC

## 2013-12-08 MED ORDER — METRONIDAZOLE 500 MG PO TABS
2000.0000 mg | ORAL_TABLET | Freq: Once | ORAL | Status: AC
  Administered 2013-12-08: 2000 mg via ORAL
  Filled 2013-12-08: qty 4

## 2013-12-08 NOTE — Discharge Instructions (Signed)
PLEASE FOLLOW UP NEX WEEK WITH OB/GYN.  IF YOU DO NOT HAVE AN OB/GYN DOCTOR, PLEASE CALL THE [facility]. Please take all of your antibiotics until finished!   You may develop abdominal discomfort or diarrhea from the antibiotic.  You may help offset this with probiotics which you can buy or get in yogurt. Do not eat  or take the probiotics until 2 hours after your antibiotic.  DO NOT TAKE DRINK ANY ALCOHOL WHILE TAKING THESE MEDICATIONS.   Trichomoniasis Trichomoniasis is an infection caused by an organism called Trichomonas. The infection can affect both women and men. In women, the outer female genitalia and the vagina are affected. In men, the penis is mainly affected, but the prostate and other reproductive organs can also be involved. Trichomoniasis is a sexually transmitted infection (STI) and is most often passed to another person through sexual contact.  RISK FACTORS  Having unprotected sexual intercourse.  Having sexual intercourse with an infected partner. SIGNS AND SYMPTOMS  Symptoms of trichomoniasis in women include:  Abnormal gray-green frothy vaginal discharge.  Itching and irritation of the vagina.  Itching and irritation of the area outside the vagina. Symptoms of trichomoniasis in men include:   Penile discharge with or without pain.  Pain during urination. This results from inflammation of the urethra. DIAGNOSIS  Trichomoniasis may be found during a Pap test or physical exam. Your health care provider may use one of the following methods to help diagnose this infection:  Examining vaginal discharge under a microscope. For men, urethral discharge would be examined.  Testing the pH of the vagina with a test tape.  Using a vaginal swab test that checks for the Trichomonas organism. A test is available that provides results within a few minutes.  Doing a culture test for the organism. This is not usually needed. TREATMENT   You may be given medicine  to fight the infection. Women should inform their health care provider if they could be or are pregnant. Some medicines used to treat the infection should not be taken during pregnancy.  Your health care provider may recommend over-the-counter medicines or creams to decrease itching or irritation.  Your sexual partner will need to be treated if infected. HOME CARE INSTRUCTIONS   Take medicines only as directed by your health care provider.  Take over-the-counter medicine for itching or irritation as directed by your health care provider.  Do not have sexual intercourse while you have the infection.  Women should not douche or wear tampons while they have the infection.  Discuss your infection with your partner. Your partner may have gotten the infection from you, or you may have gotten it from your partner.  Have your sex partner get examined and treated if necessary.  Practice safe, informed, and protected sex.  See your health care provider for other STI testing. SEEK MEDICAL CARE IF:   You still have symptoms after you finish your medicine.  You develop abdominal pain.  You have pain when you urinate.  You have bleeding after sexual intercourse.  You develop a rash.  Your medicine makes you sick or makes you throw up (vomit). MAKE SURE YOU:  Understand these instructions.  Will watch your condition.  Will get help right away if you are not doing well or get worse. Document Released: 07/07/2000 Document Revised: 05/28/2013 Document Reviewed: 10/23/2012 [facility] Patient Information 2015 [place], [place]. This information is not intended to replace advice given to you by your health care provider. Make sure  you discuss any questions you have with your health care provider.

## 2013-12-10 LAB — GC/CHLAMYDIA PROBE AMP
CT PROBE, AMP APTIMA: NEGATIVE
GC PROBE AMP APTIMA: NEGATIVE

## 2013-12-14 ENCOUNTER — Encounter: Payer: Medicaid Other | Admitting: Obstetrics & Gynecology

## 2013-12-14 ENCOUNTER — Telehealth: Payer: Self-pay | Admitting: *Deleted

## 2013-12-14 NOTE — Telephone Encounter (Signed)
Opened in error

## 2013-12-17 ENCOUNTER — Encounter: Payer: Medicaid Other | Admitting: Obstetrics and Gynecology

## 2013-12-17 ENCOUNTER — Encounter: Payer: Self-pay | Admitting: General Practice

## 2013-12-17 ENCOUNTER — Telehealth: Payer: Self-pay | Admitting: General Practice

## 2013-12-17 NOTE — Telephone Encounter (Signed)
Patient no showed for appt today. Called patient, no answer- left message stating we are calling because it appears you missed an appt in our office today, please call our front office staff to reschedule this appt. Will send letter

## 2014-02-11 ENCOUNTER — Encounter: Payer: Self-pay | Admitting: Obstetrics and Gynecology

## 2014-02-11 ENCOUNTER — Ambulatory Visit (INDEPENDENT_AMBULATORY_CARE_PROVIDER_SITE_OTHER): Payer: Medicaid Other | Admitting: Obstetrics and Gynecology

## 2014-02-11 VITALS — BP 123/76 | HR 103 | Temp 98.3°F | Ht 60.0 in | Wt 166.1 lb

## 2014-02-11 DIAGNOSIS — R1031 Right lower quadrant pain: Secondary | ICD-10-CM

## 2014-02-11 NOTE — Progress Notes (Signed)
Patient ID: Melanie Luna, female   DOB: [birth year], 39 y.o.   MRN: [id number] 38 yo G2P2 presenting for ED follow up of RLQ pain. Patient was treated outpatient in November for PID. She reports persistent daily pain not as intense as her November visit but present. The pain is localized in her RLQ and at times radiates down her RLE. Patient also reports some dysuria. Pain is well controlled with pain medication. Patient is sexually active using condoms and she has had a BTL and endometrial ablation 10 years ago.  Past Medical History  Diagnosis Date  . Diabetes mellitus    Past Surgical History  Procedure Laterality Date  . Cesarean section    . Tubal ligation     Family History  Problem Relation Age of Onset  . Gout Other   . Diabetes Other    History  Substance Use Topics  . Smoking status: Never Smoker   . Smokeless tobacco: Not on file  . Alcohol Use: No   GENERAL: Well-developed, well-nourished female in no acute distress.  ABDOMEN: Soft, nontender, nondistended. No organomegaly. PELVIC: Normal external female genitalia. Vagina is pink and rugated.  Normal discharge. Normal appearing cervix. Uterus is normal in size. No adnexal mass. Right adnexal tenderness. EXTREMITIES: No cyanosis, clubbing, or edema, 2+ distal pulses.  11/2013 CT scan IMPRESSION: Hypodense areas within the uterus with a 2.8 x 1.9 cm fluid collection in the right cornua of the uterus extending into a fluid filled right fallopian tube. The appearance is concerning for myometritis with uterine abscesses with possible hydrosalpinx versus pyosalpinx.   A/P 39 yo with RLQ pain - Will order a pelvic ultrasound for further evaluation - Urine culture also ordered - patient will be contacted with any abnormal results - RTC in 2 weeks to discuss results

## 2014-02-11 NOTE — Addendum Note (Signed)
Addended by: Novella Olive on: 02/11/2014 04:52 PM   Modules accepted: Orders

## 2014-02-13 LAB — URINE CULTURE

## 2014-03-04 ENCOUNTER — Ambulatory Visit (HOSPITAL_COMMUNITY)
Admission: RE | Admit: 2014-03-04 | Discharge: 2014-03-04 | Disposition: A | Discharge status: Home / Self Care | Payer: Medicaid Other | Source: Ambulatory Visit | Attending: Obstetrics and Gynecology | Admitting: Obstetrics and Gynecology

## 2014-03-04 DIAGNOSIS — R1031 Right lower quadrant pain: Secondary | ICD-10-CM | POA: Diagnosis present

## 2014-06-04 ENCOUNTER — Emergency Department (HOSPITAL_COMMUNITY)
Admission: EM | Admit: 2014-06-04 | Discharge: 2014-06-05 | Disposition: A | Discharge status: Home / Self Care | Payer: Medicaid Other | Attending: Emergency Medicine | Admitting: Emergency Medicine

## 2014-06-04 ENCOUNTER — Encounter (HOSPITAL_COMMUNITY): Payer: Self-pay

## 2014-06-04 DIAGNOSIS — I10 Essential (primary) hypertension: Secondary | ICD-10-CM | POA: Diagnosis not present

## 2014-06-04 DIAGNOSIS — Z794 Long term (current) use of insulin: Secondary | ICD-10-CM | POA: Insufficient documentation

## 2014-06-04 DIAGNOSIS — Z880 Allergy status to penicillin: Secondary | ICD-10-CM | POA: Diagnosis not present

## 2014-06-04 DIAGNOSIS — N390 Urinary tract infection, site not specified: Secondary | ICD-10-CM | POA: Diagnosis not present

## 2014-06-04 DIAGNOSIS — E1165 Type 2 diabetes mellitus with hyperglycemia: Secondary | ICD-10-CM | POA: Diagnosis present

## 2014-06-04 DIAGNOSIS — R5383 Other fatigue: Secondary | ICD-10-CM | POA: Insufficient documentation

## 2014-06-04 DIAGNOSIS — R630 Anorexia: Secondary | ICD-10-CM | POA: Insufficient documentation

## 2014-06-04 DIAGNOSIS — R739 Hyperglycemia, unspecified: Secondary | ICD-10-CM

## 2014-06-04 HISTORY — DX: Essential (primary) hypertension: I10

## 2014-06-04 LAB — CBG MONITORING, ED: Glucose-Capillary: 305 mg/dL — ABNORMAL HIGH (ref 70–99)

## 2014-06-04 NOTE — ED Notes (Signed)
Pt was at her primary Dr today and her blood pressure was elevated and her blood sugar was elevated and she was told to come to the ED for further evaluation. Pt states that she's moving and isn't eating like she normally does to regulate her blood sugar

## 2014-06-05 ENCOUNTER — Encounter (HOSPITAL_COMMUNITY): Payer: Self-pay | Admitting: Emergency Medicine

## 2014-06-05 LAB — CBG MONITORING, ED
Glucose-Capillary: 267 mg/dL — ABNORMAL HIGH (ref 70–99)
Glucose-Capillary: 346 mg/dL — ABNORMAL HIGH (ref 70–99)

## 2014-06-05 LAB — COMPREHENSIVE METABOLIC PANEL
ALT: 12 U/L — ABNORMAL LOW (ref 14–54)
AST: 10 U/L — ABNORMAL LOW (ref 15–41)
Albumin: 3.2 g/dL — ABNORMAL LOW (ref 3.5–5.0)
Alkaline Phosphatase: 110 U/L (ref 38–126)
Anion gap: 10 (ref 5–15)
BUN: 10 mg/dL (ref 6–20)
CALCIUM: 8.3 mg/dL — AB (ref 8.9–10.3)
CO2: 25 mmol/L (ref 22–32)
CREATININE: 0.44 mg/dL (ref 0.44–1.00)
Chloride: 104 mmol/L (ref 101–111)
Glucose, Bld: 277 mg/dL — ABNORMAL HIGH (ref 70–99)
POTASSIUM: 3.7 mmol/L (ref 3.5–5.1)
Sodium: 139 mmol/L (ref 135–145)
Total Bilirubin: 0.3 mg/dL (ref 0.3–1.2)
Total Protein: 6.4 g/dL — ABNORMAL LOW (ref 6.5–8.1)

## 2014-06-05 LAB — CBC
HCT: 35.9 % — ABNORMAL LOW (ref 36.0–46.0)
HEMOGLOBIN: 12.4 g/dL (ref 12.0–15.0)
MCH: 28.2 pg (ref 26.0–34.0)
MCHC: 34.5 g/dL (ref 30.0–36.0)
MCV: 81.6 fL (ref 78.0–100.0)
PLATELETS: 262 10*3/uL (ref 150–400)
RBC: 4.4 MIL/uL (ref 3.87–5.11)
RDW: 12.6 % (ref 11.5–15.5)
WBC: 8.6 10*3/uL (ref 4.0–10.5)

## 2014-06-05 LAB — URINALYSIS, ROUTINE W REFLEX MICROSCOPIC
Bilirubin Urine: NEGATIVE
Glucose, UA: >1000 mg/dL — AB
KETONES UR: NEGATIVE mg/dL
Nitrite: POSITIVE — AB
PH: 6 (ref 5.0–8.0)
Protein, ur: NEGATIVE mg/dL
SPECIFIC GRAVITY, URINE: 1.035 — AB (ref 1.005–1.030)
Urobilinogen, UA: 0.2 mg/dL (ref 0.0–1.0)

## 2014-06-05 LAB — URINE MICROSCOPIC-ADD ON

## 2014-06-05 MED ORDER — SULFAMETHOXAZOLE-TRIMETHOPRIM 800-160 MG PO TABS: 1.0000 | ORAL_TABLET | Freq: Two times a day (BID) | ORAL | Status: AC

## 2014-06-05 MED ORDER — SULFAMETHOXAZOLE-TRIMETHOPRIM 800-160 MG PO TABS
1.0000 | ORAL_TABLET | Freq: Once | ORAL | Status: AC
  Administered 2014-06-05: 1 via ORAL
  Filled 2014-06-05: qty 1

## 2014-06-05 MED ORDER — FLUCONAZOLE 150 MG PO TABS: 150.0000 mg | ORAL_TABLET | Freq: Every day | ORAL | Status: AC

## 2014-06-05 NOTE — Discharge Instructions (Signed)
Hyperglycemia °Hyperglycemia occurs when the glucose (sugar) in your blood is too high. Hyperglycemia can happen for many reasons, but it most often happens to people who do not know they have diabetes or are not managing their diabetes properly.  °CAUSES  °Whether you have diabetes or not, there are other causes of hyperglycemia. Hyperglycemia can occur when you have diabetes, but it can also occur in other situations that you might not be as aware of, such as: °Diabetes °· If you have diabetes and are having problems controlling your blood glucose, hyperglycemia could occur because of some of the following reasons: °¨ Not following your meal plan. °¨ Not taking your diabetes medications or not taking it properly. °¨ Exercising less or doing less activity than you normally do. °¨ Being sick. °Pre-diabetes °· This cannot be ignored. Before people develop Type 2 diabetes, they almost always have "pre-diabetes." This is when your blood glucose levels are higher than normal, but not yet high enough to be diagnosed as diabetes. Research has shown that some long-term damage to the body, especially the heart and circulatory system, may already be occurring during pre-diabetes. If you take action to manage your blood glucose when you have pre-diabetes, you may delay or prevent Type 2 diabetes from developing. °Stress °· If you have diabetes, you may be "diet" controlled or on oral medications or insulin to control your diabetes. However, you may find that your blood glucose is higher than usual in the hospital whether you have diabetes or not. This is often referred to as "stress hyperglycemia." Stress can elevate your blood glucose. This happens because of hormones put out by the body during times of stress. If stress has been the cause of your high blood glucose, it can be followed regularly by your caregiver. That way he/she can make sure your hyperglycemia does not continue to get worse or progress to  diabetes. °Steroids °· Steroids are medications that act on the infection fighting system (immune system) to block inflammation or infection. One side effect can be a rise in blood glucose. Most people can produce enough extra insulin to allow for this rise, but for those who cannot, steroids make blood glucose levels go even higher. It is not unusual for steroid treatments to "uncover" diabetes that is developing. It is not always possible to determine if the hyperglycemia will go away after the steroids are stopped. A special blood test called an A1c is sometimes done to determine if your blood glucose was elevated before the steroids were started. °SYMPTOMS °· Thirsty. °· Frequent urination. °· Dry mouth. °· Blurred vision. °· Tired or fatigue. °· Weakness. °· Sleepy. °· Tingling in feet or leg. °DIAGNOSIS  °Diagnosis is made by monitoring blood glucose in one or all of the following ways: °· A1c test. This is a chemical found in your blood. °· Fingerstick blood glucose monitoring. °· Laboratory results. °TREATMENT  °First, knowing the cause of the hyperglycemia is important before the hyperglycemia can be treated. Treatment may include, but is not be limited to: °· Education. °· Change or adjustment in medications. °· Change or adjustment in meal plan. °· Treatment for an illness, infection, etc. °· More frequent blood glucose monitoring. °· Change in exercise plan. °· Decreasing or stopping steroids. °· Lifestyle changes. °HOME CARE INSTRUCTIONS  °· Test your blood glucose as directed. °· Exercise regularly. Your caregiver will give you instructions about exercise. Pre-diabetes or diabetes which comes on with stress is helped by exercising. °· Eat wholesome,   balanced meals. Eat often and at regular, fixed times. Your caregiver or nutritionist will give you a meal plan to guide your sugar intake. °· Being at an ideal weight is important. If needed, losing as little as 10 to 15 pounds may help improve blood  glucose levels. °SEEK MEDICAL CARE IF:  °· You have questions about medicine, activity, or diet. °· You continue to have symptoms (problems such as increased thirst, urination, or weight gain). °SEEK IMMEDIATE MEDICAL CARE IF:  °· You are vomiting or have diarrhea. °· Your breath smells fruity. °· You are breathing faster or slower. °· You are very sleepy or incoherent. °· You have numbness, tingling, or pain in your feet or hands. °· You have chest pain. °· Your symptoms get worse even though you have been following your caregiver's orders. °· If you have any other questions or concerns. °Document Released: 07/07/2000 Document Revised: 04/05/2011 Document Reviewed: 05/10/2011 °ExitCare® Patient Information ©2015 [employer]. This information is not intended to replace advice given to you by your health care provider. Make sure you discuss any questions you have with your health care provider. ° °Urinary Tract Infection °Urinary tract infections (UTIs) can develop anywhere along your urinary tract. Your urinary tract is your body's drainage system for removing wastes and extra water. Your urinary tract includes two kidneys, two ureters, a bladder, and a urethra. Your kidneys are a pair of bean-shaped organs. Each kidney is about the size of your fist. They are located below your ribs, one on each side of your spine. °CAUSES °Infections are caused by microbes, which are microscopic organisms, including fungi, viruses, and bacteria. These organisms are so small that they can only be seen through a microscope. Bacteria are the microbes that most commonly cause UTIs. °SYMPTOMS  °Symptoms of UTIs may vary by age and gender of the patient and by the location of the infection. Symptoms in young women typically include a frequent and intense urge to urinate and a painful, burning feeling in the bladder or urethra during urination. Older women and men are more likely to be tired, shaky, and weak and have muscle aches and  abdominal pain. A fever may mean the infection is in your kidneys. Other symptoms of a kidney infection include pain in your back or sides below the ribs, nausea, and vomiting. °DIAGNOSIS °To diagnose a UTI, your caregiver will ask you about your symptoms. Your caregiver also will ask to provide a urine sample. The urine sample will be tested for bacteria and white blood cells. White blood cells are made by your body to help fight infection. °TREATMENT  °Typically, UTIs can be treated with medication. Because most UTIs are caused by a bacterial infection, they usually can be treated with the use of antibiotics. The choice of antibiotic and length of treatment depend on your symptoms and the type of bacteria causing your infection. °HOME CARE INSTRUCTIONS °· If you were prescribed antibiotics, take them exactly as your caregiver instructs you. Finish the medication even if you feel better after you have only taken some of the medication. °· Drink enough water and fluids to keep your urine clear or pale yellow. °· Avoid caffeine, tea, and carbonated beverages. They tend to irritate your bladder. °· Empty your bladder often. Avoid holding urine for long periods of time. °· Empty your bladder before and after sexual intercourse. °· After a bowel movement, women should cleanse from front to back. Use each tissue only once. °SEEK MEDICAL CARE IF:  °·   You have back pain. °· You develop a fever. °· Your symptoms do not begin to resolve within 3 days. °SEEK IMMEDIATE MEDICAL CARE IF:  °· You have severe back pain or lower abdominal pain. °· You develop chills. °· You have nausea or vomiting. °· You have continued burning or discomfort with urination. °MAKE SURE YOU:  °· Understand these instructions. °· Will watch your condition. °· Will get help right away if you are not doing well or get worse. °Document Released: 10/21/2004 Document Revised: 07/13/2011 Document Reviewed: 02/19/2011 °ExitCare® Patient Information ©2015  [employer]. This information is not intended to replace advice given to you by your health care provider. Make sure you discuss any questions you have with your health care provider. ° °

## 2014-06-05 NOTE — ED Provider Notes (Signed)
CSN: [id number]     Arrival date & time 06/04/14  2325 History   First MD Initiated Contact with Patient 06/05/14 0022     Chief Complaint  Patient presents with  . Hyperglycemia     (Consider location/radiation/quality/duration/timing/severity/associated sxs/prior Treatment) Patient is a 39 y.o. female presenting with hyperglycemia. The history is provided by the patient. No language interpreter was used.  Hyperglycemia Associated symptoms: fatigue   Associated symptoms: no fever   Associated symptoms comment:  She comes in for evaluation of persistently elevated blood sugars and "feeling not right" for the past couple of days. She states she has a routine for maintenance of her diabetes but the routine has been interrupted this week due to moving. No fever, nausea or vomiting. When seen by her doctor earlier today she reports her CBG was 350 and her blood pressure was elevated. She feels fatigued, but denies other symptoms.    Past Medical History  Diagnosis Date  . Diabetes mellitus   . Hypertension    Past Surgical History  Procedure Laterality Date  . Cesarean section    . Tubal ligation     Family History  Problem Relation Age of Onset  . Gout Other   . Diabetes Other    History  Substance Use Topics  . Smoking status: Never Smoker   . Smokeless tobacco: Not on file  . Alcohol Use: No   OB History    No data available     Review of Systems  Constitutional: Positive for appetite change and fatigue. Negative for fever and chills.  HENT: Negative.   Respiratory: Negative.   Cardiovascular: Negative.   Gastrointestinal: Negative.   Genitourinary: Negative.   Musculoskeletal: Negative.   Skin: Negative.   Neurological: Negative.       Allergies  Codeine and Penicillins  Home Medications   Prior to Admission medications   Medication Sig Start Date End Date Taking? Authorizing Provider  insulin aspart (NOVOLOG) 100 UNIT/ML injection Inject 25 Units into  the skin 3 (three) times daily before meals.    Yes Historical Provider, MD  insulin glargine (LANTUS) 100 UNIT/ML injection Inject 20 Units into the skin at bedtime.    Yes Historical Provider, MD  ciprofloxacin (CIPRO) 500 MG tablet Take 1 tablet (500 mg total) by mouth 2 (two) times daily. Patient not taking: Reported on 02/11/2014 12/08/13   Margarita Mail, PA-C  ibuprofen (ADVIL,MOTRIN) 800 MG tablet Take 1 tablet (800 mg total) by mouth 3 (three) times daily. Patient not taking: Reported on 02/11/2014 09/12/13   Carman Ching, PA-C  metroNIDAZOLE (FLAGYL) 500 MG tablet Take 1 tablet (500 mg total) by mouth 3 (three) times daily. Patient not taking: Reported on 02/11/2014 12/08/13   Margarita Mail, PA-C   BP 150/99 mmHg  Pulse 79  Temp(Src) 98.1 F (36.7 C) (Oral)  Resp 18  Ht 5' (1.524 m)  Wt 172 lb (78.019 kg)  BMI 33.59 kg/m2  SpO2 98% Physical Exam  Constitutional: She is oriented to person, place, and time. She appears well-developed and well-nourished.  HENT:  Head: Normocephalic.  Neck: Normal range of motion. Neck supple.  Cardiovascular: Normal rate and regular rhythm.   Pulmonary/Chest: Effort normal and breath sounds normal.  Abdominal: Soft. Bowel sounds are normal. There is no tenderness. There is no rebound and no guarding.  Musculoskeletal: Normal range of motion. She exhibits no edema.  Neurological: She is alert and oriented to person, place, and time.  Skin: Skin is warm  and dry. No rash noted.  Psychiatric: She has a normal mood and affect.    ED Course  Procedures (including critical care time) Labs Review Labs Reviewed  URINALYSIS, ROUTINE W REFLEX MICROSCOPIC - Abnormal; Notable for the following:    APPearance CLOUDY (*)    Specific Gravity, Urine 1.035 (*)    Glucose, UA >1000 (*)    Hgb urine dipstick SMALL (*)    Nitrite POSITIVE (*)    Leukocytes, UA TRACE (*)    All other components within normal limits  URINE MICROSCOPIC-ADD ON - Abnormal;  Notable for the following:    Squamous Epithelial / LPF MANY (*)    Bacteria, UA MANY (*)    All other components within normal limits  CBC - Abnormal; Notable for the following:    HCT 35.9 (*)    All other components within normal limits  CBG MONITORING, ED - Abnormal; Notable for the following:    Glucose-Capillary 305 (*)    All other components within normal limits  CBG MONITORING, ED - Abnormal; Notable for the following:    Glucose-Capillary 267 (*)    All other components within normal limits  COMPREHENSIVE METABOLIC PANEL    Imaging Review No results found.   EKG Interpretation None      MDM   Final diagnoses:  None    1. Hyperglycemia in known diabetic 2. UTI  She is well appearing. She reports feeling better with IV fluids. CBG decreasing without need for additional insulin in emergency department. VSS. Will start abx for nitrite positive urine. She has follow up in the community available for recheck later this week. Return precautions discussed.     Charlann Lange, PA-C 06/05/14 0240  Debby Freiberg, MD 06/07/14 [phone]

## 2015-02-05 ENCOUNTER — Encounter (HOSPITAL_COMMUNITY): Payer: Self-pay

## 2015-02-05 DIAGNOSIS — Z794 Long term (current) use of insulin: Secondary | ICD-10-CM | POA: Diagnosis not present

## 2015-02-05 DIAGNOSIS — R109 Unspecified abdominal pain: Secondary | ICD-10-CM | POA: Insufficient documentation

## 2015-02-05 DIAGNOSIS — I10 Essential (primary) hypertension: Secondary | ICD-10-CM | POA: Diagnosis not present

## 2015-02-05 DIAGNOSIS — Z880 Allergy status to penicillin: Secondary | ICD-10-CM | POA: Diagnosis not present

## 2015-02-05 DIAGNOSIS — H578 Other specified disorders of eye and adnexa: Secondary | ICD-10-CM | POA: Diagnosis present

## 2015-02-05 DIAGNOSIS — E1165 Type 2 diabetes mellitus with hyperglycemia: Secondary | ICD-10-CM | POA: Insufficient documentation

## 2015-02-05 DIAGNOSIS — H00014 Hordeolum externum left upper eyelid: Secondary | ICD-10-CM | POA: Insufficient documentation

## 2015-02-05 DIAGNOSIS — J069 Acute upper respiratory infection, unspecified: Secondary | ICD-10-CM | POA: Insufficient documentation

## 2015-02-05 LAB — CBC
HEMATOCRIT: 38.6 % (ref 36.0–46.0)
Hemoglobin: 12.6 g/dL (ref 12.0–15.0)
MCH: 26.9 pg (ref 26.0–34.0)
MCHC: 32.6 g/dL (ref 30.0–36.0)
MCV: 82.5 fL (ref 78.0–100.0)
PLATELETS: 277 10*3/uL (ref 150–400)
RBC: 4.68 MIL/uL (ref 3.87–5.11)
RDW: 12.3 % (ref 11.5–15.5)
WBC: 10.9 10*3/uL — AB (ref 4.0–10.5)

## 2015-02-05 LAB — URINALYSIS, ROUTINE W REFLEX MICROSCOPIC
Bilirubin Urine: NEGATIVE
Glucose, UA: >1000 mg/dL — AB
Hgb urine dipstick: NEGATIVE
KETONES UR: NEGATIVE mg/dL
Nitrite: NEGATIVE
Protein, ur: NEGATIVE mg/dL
Specific Gravity, Urine: 1.031 — ABNORMAL HIGH (ref 1.005–1.030)
pH: 5.5 (ref 5.0–8.0)

## 2015-02-05 LAB — BASIC METABOLIC PANEL
Anion gap: 11 (ref 5–15)
BUN: 12 mg/dL (ref 6–20)
CHLORIDE: 93 mmol/L — AB (ref 101–111)
CO2: 26 mmol/L (ref 22–32)
CREATININE: 0.64 mg/dL (ref 0.44–1.00)
Calcium: 9.3 mg/dL (ref 8.9–10.3)
GFR calc Af Amer: >60 mL/min (ref >60)
GFR calc non Af Amer: >60 mL/min (ref >60)
Glucose, Bld: 487 mg/dL — ABNORMAL HIGH (ref 65–99)
POTASSIUM: 4.5 mmol/L (ref 3.5–5.1)
SODIUM: 130 mmol/L — AB (ref 135–145)

## 2015-02-05 LAB — URINE MICROSCOPIC-ADD ON

## 2015-02-05 NOTE — ED Notes (Signed)
Pt has been getting over a cold the last two weeks and has been taking mucinex and delsym. She has been having right flank pain the last several days and is diabetic and has been drinking a lot of water. Also had a stye pop up her left eye 3 days ago as well. Did warm compresses and it got larger.

## 2015-02-06 ENCOUNTER — Emergency Department (HOSPITAL_COMMUNITY)
Admission: EM | Admit: 2015-02-06 | Discharge: 2015-02-06 | Disposition: A | Discharge status: Home / Self Care | Payer: Medicaid Other | Attending: Emergency Medicine | Admitting: Emergency Medicine

## 2015-02-06 DIAGNOSIS — J069 Acute upper respiratory infection, unspecified: Secondary | ICD-10-CM

## 2015-02-06 DIAGNOSIS — R739 Hyperglycemia, unspecified: Secondary | ICD-10-CM

## 2015-02-06 DIAGNOSIS — H00016 Hordeolum externum left eye, unspecified eyelid: Secondary | ICD-10-CM

## 2015-02-06 LAB — CBG MONITORING, ED: Glucose-Capillary: 420 mg/dL — ABNORMAL HIGH (ref 65–99)

## 2015-02-06 MED ORDER — SODIUM CHLORIDE 0.9 % IV BOLUS (SEPSIS): 1000.0000 mL | Freq: Once | INTRAVENOUS | Status: DC

## 2015-02-06 MED ORDER — POLYMYXIN B-TRIMETHOPRIM 10000-0.1 UNIT/ML-% OP SOLN: 1.0000 [drp] | OPHTHALMIC | Status: DC

## 2015-02-06 NOTE — ED Notes (Signed)
Pt given verbal order to self administer her home insulin via MD.

## 2015-02-06 NOTE — Discharge Instructions (Signed)
You were seen today and found to have a stye over left eye. Using use warm compresses. If the stye becomes larger or more inflamed or reddened or does not improve in the next 2-3 days, you need to be seen by the ophthalmologist. Sometimes styes had to be surgically removed. You are also noted to be hyperglycemic. You need to make sure that you're drinking enough water and taking her insulin at home as directed.  Stye A stye is a bump on your eyelid caused by a bacterial infection. A stye can form inside the eyelid (internal stye) or outside the eyelid (external stye). An internal stye may be caused by an infected oil-producing gland inside your eyelid. An external stye may be caused by an infection at the base of your eyelash (hair follicle). Styes are very common. Anyone can get them at any age. They usually occur in just one eye, but you may have more than one in either eye.  CAUSES  The infection is almost always caused by bacteria called Staphylococcus aureus. This is a common type of bacteria that lives on your skin. RISK FACTORS You may be at higher risk for a stye if you have had one before. You may also be at higher risk if you have:  Diabetes.  Long-term illness.  Long-term eye redness.  A skin condition called seborrhea.  High fat levels in your blood (lipids). SIGNS AND SYMPTOMS  Eyelid pain is the most common symptom of a stye. Internal styes are more painful than external styes. Other signs and symptoms may include:  Painful swelling of your eyelid.  A scratchy feeling in your eye.  Tearing and redness of your eye.  Pus draining from the stye. DIAGNOSIS  Your health care provider may be able to diagnose a stye just by examining your eye. The health care provider may also check to make sure:  You do not have a fever or other signs of a more serious infection.  The infection has not spread to other parts of your eye or areas around your eye. TREATMENT  Most styes will  clear up in a few days without treatment. In some cases, you may need to use antibiotic drops or ointment to prevent infection. Your health care provider may have to drain the stye surgically if your stye is:  Large.  Causing a lot of pain.  Interfering with your vision. This can be done using a thin blade or a needle.  HOME CARE INSTRUCTIONS   Take medicines only as directed by your health care provider.  Apply a clean, warm compress to your eye for 10 minutes, 4 times a day.  Do not wear contact lenses or eye makeup until your stye has healed.  Do not try to pop or drain the stye. SEEK MEDICAL CARE IF:  You have chills or a fever.  Your stye does not go away after several days.  Your stye affects your vision.  Your eyeball becomes swollen, red, or painful. MAKE SURE YOU:  Understand these instructions.  Will watch your condition.  Will get help right away if you are not doing well or get worse.   This information is not intended to replace advice given to you by your health care provider. Make sure you discuss any questions you have with your health care provider.   Document Released: 10/21/2004 Document Revised: 02/01/2014 Document Reviewed: 04/27/2013 Elsevier Interactive Patient Education 2016 [place]. Hyperglycemia Hyperglycemia occurs when the glucose (sugar) in your blood  is too high. Hyperglycemia can happen for many reasons, but it most often happens to people who do not know they have diabetes or are not managing their diabetes properly.  CAUSES  Whether you have diabetes or not, there are other causes of hyperglycemia. Hyperglycemia can occur when you have diabetes, but it can also occur in other situations that you might not be as aware of, such as: Diabetes  If you have diabetes and are having problems controlling your blood glucose, hyperglycemia could occur because of some of the following reasons:  Not following your meal plan.  Not taking your  diabetes medications or not taking it properly.  Exercising less or doing less activity than you normally do.  Being sick. Pre-diabetes  This cannot be ignored. Before people develop Type 2 diabetes, they almost always have "pre-diabetes." This is when your blood glucose levels are higher than normal, but not yet high enough to be diagnosed as diabetes. Research has shown that some long-term damage to the body, especially the heart and circulatory system, may already be occurring during pre-diabetes. If you take action to manage your blood glucose when you have pre-diabetes, you may delay or prevent Type 2 diabetes from developing. Stress  If you have diabetes, you may be "diet" controlled or on oral medications or insulin to control your diabetes. However, you may find that your blood glucose is higher than usual in the hospital whether you have diabetes or not. This is often referred to as "stress hyperglycemia." Stress can elevate your blood glucose. This happens because of hormones put out by the body during times of stress. If stress has been the cause of your high blood glucose, it can be followed regularly by your caregiver. That way he/she can make sure your hyperglycemia does not continue to get worse or progress to diabetes. Steroids  Steroids are medications that act on the infection fighting system (immune system) to block inflammation or infection. One side effect can be a rise in blood glucose. Most people can produce enough extra insulin to allow for this rise, but for those who cannot, steroids make blood glucose levels go even higher. It is not unusual for steroid treatments to "uncover" diabetes that is developing. It is not always possible to determine if the hyperglycemia will go away after the steroids are stopped. A special blood test called an A1c is sometimes done to determine if your blood glucose was elevated before the steroids were started. SYMPTOMS  Thirsty.  Frequent  urination.  Dry mouth.  Blurred vision.  Tired or fatigue.  Weakness.  Sleepy.  Tingling in feet or leg. DIAGNOSIS  Diagnosis is made by monitoring blood glucose in one or all of the following ways:  A1c test. This is a chemical found in your blood.  Fingerstick blood glucose monitoring.  Laboratory results. TREATMENT  First, knowing the cause of the hyperglycemia is important before the hyperglycemia can be treated. Treatment may include, but is not be limited to:  Education.  Change or adjustment in medications.  Change or adjustment in meal plan.  Treatment for an illness, infection, etc.  More frequent blood glucose monitoring.  Change in exercise plan.  Decreasing or stopping steroids.  Lifestyle changes. HOME CARE INSTRUCTIONS   Test your blood glucose as directed.  Exercise regularly. Your caregiver will give you instructions about exercise. Pre-diabetes or diabetes which comes on with stress is helped by exercising.  Eat wholesome, balanced meals. Eat often and at regular, fixed times.  Your caregiver or nutritionist will give you a meal plan to guide your sugar intake.  Being at an ideal weight is important. If needed, losing as little as 10 to 15 pounds may help improve blood glucose levels. SEEK MEDICAL CARE IF:   You have questions about medicine, activity, or diet.  You continue to have symptoms (problems such as increased thirst, urination, or weight gain). SEEK IMMEDIATE MEDICAL CARE IF:   You are vomiting or have diarrhea.  Your breath smells fruity.  You are breathing faster or slower.  You are very sleepy or incoherent.  You have numbness, tingling, or pain in your feet or hands.  You have chest pain.  Your symptoms get worse even though you have been following your caregiver's orders.  If you have any other questions or concerns.   This information is not intended to replace advice given to you by your health care provider. Make  sure you discuss any questions you have with your health care provider.   Document Released: 07/07/2000 Document Revised: 04/05/2011 Document Reviewed: 09/17/2014 Elsevier Interactive Patient Education [employer].

## 2015-02-06 NOTE — ED Provider Notes (Signed)
CSN: [id number]     Arrival date & time 02/05/15  2211 History  By signing my name below, I, Altamease Oiler, attest that this documentation has been prepared under the direction and in the presence of Merryl Hacker, MD. Electronically Signed: Altamease Oiler, ED Scribe. 02/06/2015. 4:05 AM   Chief Complaint  Patient presents with  . Flank Pain  . Stye    The history is provided by the patient. No language interpreter was used.   Melanie Luna is a 40 y.o. female with PMHx of HTN and DM who presents to the Emergency Department complaining of an area of constant, 9/10 in severity, pain and swelling at the left eye with gradual onset 2 days ago. Associated symptoms include left eye crusting. Patient reports that she put a warm compress on it once but it seemed to get bigger.  Pt also complains of intermittent right flank pain with onset 3-4 days ago. No current pain. Pain was worse with coughing. Pt states that she is getting over a cold with a nonproductive cough 2 weeks. She treated her cold with 4 days of Mucinex and notes that the coughing increased so she started using Delsym. The flank pain is exacerbated by her coughing. She has no pain at present. Pt denies nausea, vomiting, hematuria, dysuria, fever, and chills. Pt did not take her insulin at all yesterday because she did not feel well.  Past Medical History  Diagnosis Date  . Diabetes mellitus   . Hypertension    Past Surgical History  Procedure Laterality Date  . Cesarean section    . Tubal ligation     Family History  Problem Relation Age of Onset  . Gout Other   . Diabetes Other    Social History  Substance Use Topics  . Smoking status: Never Smoker   . Smokeless tobacco: None  . Alcohol Use: No   OB History    No data available     Review of Systems  Constitutional: Negative for fever and chills.  Eyes: Positive for discharge.       Pin and swelling at the left eye.  Respiratory: Positive for cough.  Negative for shortness of breath.   Cardiovascular: Negative for chest pain.  Gastrointestinal: Negative for nausea, vomiting and diarrhea.  Genitourinary: Positive for flank pain. Negative for dysuria and difficulty urinating.  All other systems reviewed and are negative.  Allergies  Codeine and Penicillins  Home Medications   Prior to Admission medications   Medication Sig Start Date End Date Taking? Authorizing Provider  ciprofloxacin (CIPRO) 500 MG tablet Take 1 tablet (500 mg total) by mouth 2 (two) times daily. Patient not taking: Reported on 02/11/2014 12/08/13   Margarita Mail, PA-C  ibuprofen (ADVIL,MOTRIN) 800 MG tablet Take 1 tablet (800 mg total) by mouth 3 (three) times daily. Patient not taking: Reported on 02/11/2014 09/12/13   Carman Ching, PA-C  insulin aspart (NOVOLOG) 100 UNIT/ML injection Inject 25 Units into the skin 3 (three) times daily before meals.     Historical Provider, MD  insulin glargine (LANTUS) 100 UNIT/ML injection Inject 20 Units into the skin at bedtime.     Historical Provider, MD  metroNIDAZOLE (FLAGYL) 500 MG tablet Take 1 tablet (500 mg total) by mouth 3 (three) times daily. Patient not taking: Reported on 02/11/2014 12/08/13   Margarita Mail, PA-C   BP 126/98 mmHg  Pulse 89  Temp(Src) 98.4 F (36.9 C) (Oral)  Resp 16  SpO2 97% Physical  Exam  Constitutional: She is oriented to person, place, and time. She appears well-developed and well-nourished. No distress.  HENT:  Head: Normocephalic and atraumatic.  Eyes: Conjunctivae are normal. Pupils are equal, round, and reactive to light.  The discharge noted, patient has a 3 x 3 cm stye over the mid upper lid of the left eye, no significant erythema or swelling  Cardiovascular: Normal rate, regular rhythm and normal heart sounds.   No murmur heard. Pulmonary/Chest: Effort normal and breath sounds normal. No respiratory distress. She has no wheezes.  Abdominal: Soft. Bowel sounds are normal. There is  no tenderness.  Genitourinary:  No CVA tenderness  Neurological: She is alert and oriented to person, place, and time.  Skin: Skin is warm and dry.  Psychiatric: She has a normal mood and affect.  Nursing note and vitals reviewed.   ED Course  Procedures (including critical care time)  DIAGNOSTIC STUDIES: Oxygen Saturation is 97% on RA,  normal by my interpretation.    COORDINATION OF CARE: 3:47 AM Discussed treatment plan which includes lab work with pt at bedside and pt agreed to plan. Pt declines IVF and prefers to take her own insulin.   Labs Review Labs Reviewed  URINALYSIS, ROUTINE W REFLEX MICROSCOPIC (NOT AT [facility]) - Abnormal; Notable for the following:    Specific Gravity, Urine 1.031 (*)    Glucose, UA >1000 (*)    Leukocytes, UA TRACE (*)    All other components within normal limits  BASIC METABOLIC PANEL - Abnormal; Notable for the following:    Sodium 130 (*)    Chloride 93 (*)    Glucose, Bld 487 (*)    All other components within normal limits  CBC - Abnormal; Notable for the following:    WBC 10.9 (*)    All other components within normal limits  URINE MICROSCOPIC-ADD ON - Abnormal; Notable for the following:    Squamous Epithelial / LPF 0-5 (*)    Bacteria, UA MANY (*)    All other components within normal limits  CBG MONITORING, ED - Abnormal; Notable for the following:    Glucose-Capillary 420 (*)    All other components within normal limits    Imaging Review No results found. I have personally reviewed and evaluated these lab results as part of my medical decision-making.   EKG Interpretation None      MDM   Final diagnoses:  Upper respiratory infection  Stye, left  Hyperglycemia    Patient presents with recent ongoing cough, stye, and hyperglycemia. She is nontoxic and afebrile on exam. Cough is improved with over-the-counter medications that she's not had any fevers. She reports right flank pain but worsened with cough. No pain at  present. Lab work sent from triage notable for hyperglycemia without an anion gap. Urinalysis without evidence of infection. No CVA tenderness. Suspect viral etiology of cough and musculoskeletal strain as the cause of patient's flank pain. Regarding the stye, no acute infection noted. Patient does report discharge of the left eye in the morning. For this reason, she will be given Polytrim. Continue warm compresses. If not improved in to 3 days, she needs follow-up with ophthalmology for further evaluation. Regarding patient's hyperglycemia. She has no evidence of anion gap. She has not taken her meds in one day. She does not wish to have fluids and has declined insulin here. However, she is willing to take her own insulin here which I have authorized. She is currently asymptomatic from her hyperglycemia. I  have encouraged her to take her insulin as directed to avoid further episodes of hypoglycemia. Patient stated understanding.  After history, exam, and medical workup I feel the patient has been appropriately medically screened and is safe for discharge home. Pertinent diagnoses were discussed with the patient. Patient was given return precautions.   I personally performed the services described in this documentation, which was scribed in my presence. The recorded information has been reviewed and is accurate.    Merryl Hacker, MD 02/06/15 [phone]

## 2016-01-06 ENCOUNTER — Encounter (HOSPITAL_COMMUNITY): Payer: Self-pay | Admitting: Emergency Medicine

## 2016-01-06 ENCOUNTER — Emergency Department (HOSPITAL_COMMUNITY)
Admission: EM | Admit: 2016-01-06 | Discharge: 2016-01-07 | Disposition: A | Discharge status: Home / Self Care | Payer: Medicaid Other | Attending: Emergency Medicine | Admitting: Emergency Medicine

## 2016-01-06 DIAGNOSIS — E119 Type 2 diabetes mellitus without complications: Secondary | ICD-10-CM | POA: Insufficient documentation

## 2016-01-06 DIAGNOSIS — Z794 Long term (current) use of insulin: Secondary | ICD-10-CM | POA: Diagnosis not present

## 2016-01-06 DIAGNOSIS — Z79899 Other long term (current) drug therapy: Secondary | ICD-10-CM | POA: Diagnosis not present

## 2016-01-06 DIAGNOSIS — N12 Tubulo-interstitial nephritis, not specified as acute or chronic: Secondary | ICD-10-CM

## 2016-01-06 DIAGNOSIS — I10 Essential (primary) hypertension: Secondary | ICD-10-CM | POA: Diagnosis not present

## 2016-01-06 DIAGNOSIS — R110 Nausea: Secondary | ICD-10-CM | POA: Diagnosis present

## 2016-01-06 DIAGNOSIS — Z7982 Long term (current) use of aspirin: Secondary | ICD-10-CM | POA: Diagnosis not present

## 2016-01-06 LAB — CBC WITH DIFFERENTIAL/PLATELET
Basophils Absolute: 0 10*3/uL (ref 0.0–0.1)
Basophils Relative: 0 %
EOS ABS: 0 10*3/uL (ref 0.0–0.7)
Eosinophils Relative: 0 %
HEMATOCRIT: 37.2 % (ref 36.0–46.0)
HEMOGLOBIN: 12.2 g/dL (ref 12.0–15.0)
LYMPHS ABS: 2 10*3/uL (ref 0.7–4.0)
LYMPHS PCT: 13 %
MCH: 26.6 pg (ref 26.0–34.0)
MCHC: 32.8 g/dL (ref 30.0–36.0)
MCV: 81 fL (ref 78.0–100.0)
MONOS PCT: 12 %
Monocytes Absolute: 1.9 10*3/uL — ABNORMAL HIGH (ref 0.1–1.0)
NEUTROS ABS: 11.5 10*3/uL — AB (ref 1.7–7.7)
NEUTROS PCT: 75 %
Platelets: 223 10*3/uL (ref 150–400)
RBC: 4.59 MIL/uL (ref 3.87–5.11)
RDW: 12.5 % (ref 11.5–15.5)
WBC: 15.4 10*3/uL — ABNORMAL HIGH (ref 4.0–10.5)

## 2016-01-06 LAB — URINALYSIS, ROUTINE W REFLEX MICROSCOPIC
BILIRUBIN URINE: NEGATIVE
Glucose, UA: >=500 mg/dL — AB
Ketones, ur: 5 mg/dL — AB
Nitrite: POSITIVE — AB
PH: 6 (ref 5.0–8.0)
Protein, ur: NEGATIVE mg/dL
SPECIFIC GRAVITY, URINE: 1.027 (ref 1.005–1.030)

## 2016-01-06 LAB — COMPREHENSIVE METABOLIC PANEL
ALT: 16 U/L (ref 14–54)
ANION GAP: 10 (ref 5–15)
AST: 15 U/L (ref 15–41)
Albumin: 3.8 g/dL (ref 3.5–5.0)
Alkaline Phosphatase: 116 U/L (ref 38–126)
BILIRUBIN TOTAL: 0.6 mg/dL (ref 0.3–1.2)
BUN: 14 mg/dL (ref 6–20)
CO2: 25 mmol/L (ref 22–32)
Calcium: 9.1 mg/dL (ref 8.9–10.3)
Chloride: 97 mmol/L — ABNORMAL LOW (ref 101–111)
Creatinine, Ser: 0.78 mg/dL (ref 0.44–1.00)
GFR calc Af Amer: >60 mL/min (ref >60)
Glucose, Bld: 363 mg/dL — ABNORMAL HIGH (ref 65–99)
POTASSIUM: 4.4 mmol/L (ref 3.5–5.1)
Sodium: 132 mmol/L — ABNORMAL LOW (ref 135–145)
TOTAL PROTEIN: 7.7 g/dL (ref 6.5–8.1)

## 2016-01-06 LAB — PREGNANCY, URINE: PREG TEST UR: NEGATIVE

## 2016-01-06 LAB — I-STAT CG4 LACTIC ACID, ED: Lactic Acid, Venous: 1.13 mmol/L (ref 0.5–1.9)

## 2016-01-06 MED ORDER — ONDANSETRON HCL 4 MG/2ML IJ SOLN
4.0000 mg | Freq: Once | INTRAMUSCULAR | Status: AC
  Administered 2016-01-06: 4 mg via INTRAVENOUS
  Filled 2016-01-06: qty 2

## 2016-01-06 MED ORDER — SODIUM CHLORIDE 0.9 % IV BOLUS (SEPSIS)
1000.0000 mL | Freq: Once | INTRAVENOUS | Status: AC
  Administered 2016-01-06: 1000 mL via INTRAVENOUS

## 2016-01-06 MED ORDER — DEXTROSE 5 % IV SOLN
1.0000 g | Freq: Once | INTRAVENOUS | Status: AC
  Administered 2016-01-06: 1 g via INTRAVENOUS
  Filled 2016-01-06: qty 10

## 2016-01-06 NOTE — ED Provider Notes (Signed)
[facility] DEPT Provider Note   CSN: [id number] Arrival date & time: 01/06/16  2015     History   Chief Complaint Chief Complaint  Patient presents with  . Multiple Complaints    HPI Melanie Luna is a 40 y.o. female.  HPI Pt comes in with multiple complains. She is diabetic. Pt started getting sick on Friday. Pt started having loose BM, nausea, chills at that time with malaise. On Sat pt started having body aches, and anorexia. Titus Dubin pt started feeling a little better, but after a little activity, she started having worsening chills and nausea. Today, pt forced herself to work. While at work she had sudden burst of chills again and then she almost fainted.  Pt c/o myalgias and some R sided back pain. Pt feels like she has to strain when she urinates. No vaginal discharge, or bleeding. + moderately severe headache, but no neck pain.  Past Medical History:  Diagnosis Date  . Diabetes mellitus   . Hypertension     There are no active problems to display for this patient.   Past Surgical History:  Procedure Laterality Date  . CESAREAN SECTION    . TUBAL LIGATION      OB History    No data available       Home Medications    Prior to Admission medications   Medication Sig Start Date End Date Taking? Authorizing Provider  aspirin EC 81 MG tablet Take 81 mg by mouth daily.   Yes Historical Provider, MD  insulin aspart (NOVOLOG FLEXPEN) 100 UNIT/ML FlexPen Inject 15 Units into the skin 3 (three) times daily with meals.   Yes Historical Provider, MD  Insulin Degludec (TRESIBA FLEXTOUCH) 200 UNIT/ML SOPN Inject 10 Units into the skin 2 (two) times daily.   Yes Historical Provider, MD  ciprofloxacin (CIPRO) 500 MG tablet Take 1 tablet (500 mg total) by mouth 2 (two) times daily. Patient not taking: Reported on 02/11/2014 12/08/13   Margarita Mail, PA-C  ibuprofen (ADVIL,MOTRIN) 800 MG tablet Take 1 tablet (800 mg total) by mouth 3 (three) times daily. Patient not  taking: Reported on 02/11/2014 09/12/13   Carman Ching, PA-C  metroNIDAZOLE (FLAGYL) 500 MG tablet Take 1 tablet (500 mg total) by mouth 3 (three) times daily. Patient not taking: Reported on 02/11/2014 12/08/13   Margarita Mail, PA-C  trimethoprim-polymyxin b (POLYTRIM) ophthalmic solution Place 1 drop into the left eye every 4 (four) hours. Patient not taking: Reported on 01/06/2016 02/06/15   Merryl Hacker, MD    Family History Family History  Problem Relation Age of Onset  . Gout Other   . Diabetes Other     Social History Social History  Substance Use Topics  . Smoking status: Never Smoker  . Smokeless tobacco: Never Used  . Alcohol use No     Allergies   Codeine and Penicillins   Review of Systems Review of Systems  ROS 10 Systems reviewed and are negative for acute change except as noted in the HPI.     Physical Exam Updated Vital Signs BP 136/96 (BP Location: Right Arm)   Pulse 110   Temp 101.8 F (38.8 C) (Oral)   Resp 18   SpO2 100%   Physical Exam  Constitutional: She is oriented to person, place, and time. She appears well-developed and well-nourished.  HENT:  Head: Normocephalic and atraumatic.  Eyes: EOM are normal. Pupils are equal, round, and reactive to light.  Neck: Normal range of motion.  Neck supple.  No nuchal rigidity  Cardiovascular: Normal rate, regular rhythm and normal heart sounds.   No murmur heard. Pulmonary/Chest: Effort normal. No respiratory distress.  Abdominal: Soft. She exhibits no distension. There is no tenderness. There is no rebound and no guarding.  Genitourinary:  Genitourinary Comments: Flank tenderness on the R side  Neurological: She is alert and oriented to person, place, and time.  Skin: Skin is warm and dry.  Nursing note and vitals reviewed.    ED Treatments / Results  Labs (all labs ordered are listed, but only abnormal results are displayed) Labs Reviewed  COMPREHENSIVE METABOLIC PANEL - Abnormal;  Notable for the following:       Result Value   Sodium 132 (*)    Chloride 97 (*)    Glucose, Bld 363 (*)    All other components within normal limits  URINALYSIS, ROUTINE W REFLEX MICROSCOPIC - Abnormal; Notable for the following:    Glucose, UA >=500 (*)    Hgb urine dipstick SMALL (*)    Ketones, ur 5 (*)    Nitrite POSITIVE (*)    Leukocytes, UA SMALL (*)    Bacteria, UA RARE (*)    Squamous Epithelial / LPF 0-5 (*)    All other components within normal limits  CBC WITH DIFFERENTIAL/PLATELET - Abnormal; Notable for the following:    WBC 15.4 (*)    Neutro Abs 11.5 (*)    Monocytes Absolute 1.9 (*)    All other components within normal limits  URINE CULTURE  PREGNANCY, URINE  CBC WITH DIFFERENTIAL/PLATELET  I-STAT CG4 LACTIC ACID, ED  CBG MONITORING, ED    EKG  EKG Interpretation None       Radiology No results found.  Procedures Procedures (including critical care time)  Medications Ordered in ED Medications  sodium chloride 0.9 % bolus 1,000 mL (1,000 mLs Intravenous New Bag/Given 01/06/16 2303)  cefTRIAXone (ROCEPHIN) 1 g in dextrose 5 % 50 mL IVPB (0 g Intravenous Stopped 01/07/16 0021)  ondansetron (ZOFRAN) injection 4 mg (4 mg Intravenous Given 01/06/16 2306)  insulin aspart (novoLOG) injection 6 Units (6 Units Subcutaneous Given 01/07/16 0033)  acetaminophen (TYLENOL) tablet 650 mg (650 mg Oral Given 01/07/16 0020)     Initial Impression / Assessment and Plan / ED Course  I have reviewed the triage vital signs and the nursing notes.  Pertinent labs & imaging results that were available during my care of the patient were reviewed by me and considered in my medical decision making (see chart for details).  Clinical Course as of Jan 06 106  Wed Jan 07, 2016  0104 PO challenge passed. We will d/c as pyelonephritis. Strict ER return precautions have been discussed, and patient is agreeing with the plan and is comfortable with the workup done and the  recommendations from the ER.   [AN]    Clinical Course User Index [AN] Varney Biles, MD    Pt comes in with multiple vague symptoms - ROS is really + for malaise, myalgias, some urinary discomfort, back pain and headche. Pt is diabetic. UA is +. Clinical concerns for pyelonephritis.  Pt hydrated. Oral challenge started ivab in the ER.  Will reassess and decide on the dispo. Lactate is WNL. No DKA.  Final Clinical Impressions(s) / ED Diagnoses   Final diagnoses:  Pyelonephritis    New Prescriptions New Prescriptions   No medications on file     Varney Biles, MD 01/07/16 [phone]

## 2016-01-06 NOTE — ED Triage Notes (Signed)
Pt c/o multiple symptoms that began 3-4 days ago; c/o generalized body pain that is worse in her right flank; pt states she has chest pain when she lays down; pt has been experiencing extreme feelings of hot and cold;  Denies N/V; states she hasn't had a BM since Friday which is very abnormal for her; pt is diabetic

## 2016-01-07 LAB — CBG MONITORING, ED: Glucose-Capillary: 237 mg/dL — ABNORMAL HIGH (ref 65–99)

## 2016-01-07 MED ORDER — ONDANSETRON 4 MG PO TBDP: 4.0000 mg | ORAL_TABLET | Freq: Three times a day (TID) | ORAL | 0 refills | Status: DC | PRN

## 2016-01-07 MED ORDER — ACETAMINOPHEN 325 MG PO TABS
650.0000 mg | ORAL_TABLET | Freq: Once | ORAL | Status: AC
  Administered 2016-01-07: 650 mg via ORAL
  Filled 2016-01-07: qty 2

## 2016-01-07 MED ORDER — INSULIN ASPART 100 UNIT/ML ~~LOC~~ SOLN
6.0000 [IU] | Freq: Once | SUBCUTANEOUS | Status: AC
  Administered 2016-01-07: 6 [IU] via SUBCUTANEOUS
  Filled 2016-01-07: qty 1

## 2016-01-07 MED ORDER — CEPHALEXIN 500 MG PO CAPS: 500.0000 mg | ORAL_CAPSULE | Freq: Two times a day (BID) | ORAL | 0 refills | Status: DC

## 2016-01-07 NOTE — ED Notes (Signed)
Notified provider of patients temperature and her request for "diabtetes medication".

## 2016-01-07 NOTE — Discharge Instructions (Signed)
You have kidney infection. Please take the meds prescribed. See your doctor in 1 week. Please return to the ER if your symptoms worsen; you have increased pain, fevers, chills, inability to keep any medications down, confusion. Otherwise see the outpatient doctor as requested.

## 2016-01-10 LAB — URINE CULTURE

## 2016-01-11 ENCOUNTER — Telehealth: Payer: Self-pay

## 2016-01-11 NOTE — Telephone Encounter (Signed)
Post ED Visit - Positive Culture Follow-up  Culture report reviewed by antimicrobial stewardship pharmacist:  []  Elenor Quinones, Pharm.D. []  Heide Guile, Pharm.D., BCPS []  Parks Neptune, Pharm.D. []  Alycia Rossetti, Pharm.D., BCPS []  [place], Pharm.D., BCPS, AAHIVP []  Legrand Como, Pharm.D., BCPS, AAHIVP []  Milus Glazier, Pharm.D. []  Rob Evette Doffing, Pharm.D. Ebony Hail Masters Pharm D Positive urine culture Treated with Cephalexin, organism sensitive to the same and no further patient follow-up is required at this time.  Genia Del 01/11/2016, 2:10 PM

## 2016-03-03 ENCOUNTER — Encounter (HOSPITAL_COMMUNITY): Payer: Self-pay | Admitting: Family Medicine

## 2016-03-03 ENCOUNTER — Emergency Department (HOSPITAL_COMMUNITY)
Admission: EM | Admit: 2016-03-03 | Discharge: 2016-03-04 | Disposition: A | Discharge status: Home / Self Care | Payer: Medicaid Other | Attending: Emergency Medicine | Admitting: Emergency Medicine

## 2016-03-03 ENCOUNTER — Emergency Department (HOSPITAL_COMMUNITY): Payer: Medicaid Other

## 2016-03-03 DIAGNOSIS — N938 Other specified abnormal uterine and vaginal bleeding: Secondary | ICD-10-CM | POA: Diagnosis present

## 2016-03-03 DIAGNOSIS — Z794 Long term (current) use of insulin: Secondary | ICD-10-CM | POA: Diagnosis not present

## 2016-03-03 DIAGNOSIS — I10 Essential (primary) hypertension: Secondary | ICD-10-CM | POA: Diagnosis not present

## 2016-03-03 DIAGNOSIS — R739 Hyperglycemia, unspecified: Secondary | ICD-10-CM

## 2016-03-03 DIAGNOSIS — Z7982 Long term (current) use of aspirin: Secondary | ICD-10-CM | POA: Diagnosis not present

## 2016-03-03 DIAGNOSIS — E1165 Type 2 diabetes mellitus with hyperglycemia: Secondary | ICD-10-CM | POA: Insufficient documentation

## 2016-03-03 DIAGNOSIS — Z79899 Other long term (current) drug therapy: Secondary | ICD-10-CM | POA: Diagnosis not present

## 2016-03-03 DIAGNOSIS — N939 Abnormal uterine and vaginal bleeding, unspecified: Secondary | ICD-10-CM

## 2016-03-03 LAB — CBC WITH DIFFERENTIAL/PLATELET
Basophils Absolute: 0 10*3/uL (ref 0.0–0.1)
Basophils Relative: 0 %
EOS PCT: 1 %
Eosinophils Absolute: 0.1 10*3/uL (ref 0.0–0.7)
HEMATOCRIT: 39.2 % (ref 36.0–46.0)
Hemoglobin: 12.9 g/dL (ref 12.0–15.0)
Lymphocytes Relative: 36 %
Lymphs Abs: 4.6 10*3/uL — ABNORMAL HIGH (ref 0.7–4.0)
MCH: 26.6 pg (ref 26.0–34.0)
MCHC: 32.9 g/dL (ref 30.0–36.0)
MCV: 80.8 fL (ref 78.0–100.0)
MONO ABS: 1.2 10*3/uL — AB (ref 0.1–1.0)
MONOS PCT: 9 %
NEUTROS PCT: 54 %
Neutro Abs: 6.9 10*3/uL (ref 1.7–7.7)
Platelets: 275 10*3/uL (ref 150–400)
RBC: 4.85 MIL/uL (ref 3.87–5.11)
RDW: 13.3 % (ref 11.5–15.5)
WBC: 12.8 10*3/uL — AB (ref 4.0–10.5)

## 2016-03-03 LAB — URINALYSIS, ROUTINE W REFLEX MICROSCOPIC
BILIRUBIN URINE: NEGATIVE
HGB URINE DIPSTICK: NEGATIVE
KETONES UR: NEGATIVE mg/dL
NITRITE: NEGATIVE
PH: 5 (ref 5.0–8.0)
Protein, ur: NEGATIVE mg/dL
Specific Gravity, Urine: 1.033 — ABNORMAL HIGH (ref 1.005–1.030)

## 2016-03-03 LAB — BASIC METABOLIC PANEL
ANION GAP: 10 (ref 5–15)
BUN: 11 mg/dL (ref 6–20)
CHLORIDE: 98 mmol/L — AB (ref 101–111)
CO2: 25 mmol/L (ref 22–32)
CREATININE: 0.66 mg/dL (ref 0.44–1.00)
Calcium: 8.8 mg/dL — ABNORMAL LOW (ref 8.9–10.3)
GFR calc non Af Amer: >60 mL/min (ref >60)
Glucose, Bld: 359 mg/dL — ABNORMAL HIGH (ref 65–99)
POTASSIUM: 4.2 mmol/L (ref 3.5–5.1)
SODIUM: 133 mmol/L — AB (ref 135–145)

## 2016-03-03 LAB — I-STAT BETA HCG BLOOD, ED (MC, WL, AP ONLY): I-stat hCG, quantitative: <5 m[IU]/mL (ref <5)

## 2016-03-03 LAB — CBG MONITORING, ED: Glucose-Capillary: 303 mg/dL — ABNORMAL HIGH (ref 65–99)

## 2016-03-03 MED ORDER — SODIUM CHLORIDE 0.9 % IV BOLUS (SEPSIS)
1000.0000 mL | Freq: Once | INTRAVENOUS | Status: AC
  Administered 2016-03-04: 1000 mL via INTRAVENOUS

## 2016-03-03 NOTE — ED Provider Notes (Signed)
[facility] DEPT Provider Note   CSN: [id number] Arrival date & time: 03/03/16  1941   By signing my name below, I, Eunice Blase, attest that this documentation has been prepared under the direction and in the presence of [facility], PA-C. Electronically Signed: Eunice Blase, Scribe. 03/03/16. 11:13 PM.   History   Chief Complaint Chief Complaint  Patient presents with  . Vaginal Bleeding   The history is provided by the patient and medical records. No language interpreter was used.    HPI Comments: Nehal S Nicklin is a 41 y.o. female with Hx of DM who presents to the Emergency Department complaining of intermittent vaginal bleeding that started 02/10/2016. She states the bleeding was similar to her period, lasted ~4 days following sexual intercourse and it subsided until ~1 week ago. She adds associated chest pain that has subsided. Pt denies abdominal pain, constipation, rectal bleeding, hematuria, SOB, cough, fever, lightheadedness, dizziness and pelvic or vaginal pain. No OB/GYN noted. LNMP ~14 years ago d/t tubal ligation and NovaSure placement.  She further states she takes novalol and insulin at home, but she ran out ~3 days ago. She notes associated headache similar to past episodes of hyperglycemia.  Pt notes she was seen 2-3 weeks ago for dizziness and weakness Dx'ed with a UTI and possible kidney infection. She states she had not eaten in several days because she was working double shifts. She notes the prescribed medications she was given provided relief.  Past Medical History:  Diagnosis Date  . Diabetes mellitus   . Hypertension     There are no active problems to display for this patient.   Past Surgical History:  Procedure Laterality Date  . CESAREAN SECTION    . TUBAL LIGATION      OB History    No data available       Home Medications    Prior to Admission medications   Medication Sig Start Date End Date Taking? Authorizing Provider  aspirin EC 81  MG tablet Take 81 mg by mouth daily.    Historical Provider, MD  cephALEXin (KEFLEX) 500 MG capsule Take 1 capsule (500 mg total) by mouth 2 (two) times daily. 01/07/16   Varney Biles, MD  ciprofloxacin (CIPRO) 500 MG tablet Take 1 tablet (500 mg total) by mouth 2 (two) times daily. Patient not taking: Reported on 02/11/2014 12/08/13   Margarita Mail, PA-C  ibuprofen (ADVIL,MOTRIN) 800 MG tablet Take 1 tablet (800 mg total) by mouth 3 (three) times daily. Patient not taking: Reported on 02/11/2014 09/12/13   Hessie Diener Hess, PA-C  insulin aspart (NOVOLOG FLEXPEN) 100 UNIT/ML FlexPen Inject 15 Units into the skin 3 (three) times daily with meals. 03/04/16   Clayton Bibles, PA-C  Insulin Degludec (TRESIBA FLEXTOUCH) 200 UNIT/ML SOPN Inject 10 Units into the skin 2 (two) times daily. 03/04/16   Clayton Bibles, PA-C  metroNIDAZOLE (FLAGYL) 500 MG tablet Take 1 tablet (500 mg total) by mouth 3 (three) times daily. Patient not taking: Reported on 02/11/2014 12/08/13   Margarita Mail, PA-C  ondansetron (ZOFRAN ODT) 4 MG disintegrating tablet Take 1 tablet (4 mg total) by mouth every 8 (eight) hours as needed for nausea or vomiting. 01/07/16   Varney Biles, MD  trimethoprim-polymyxin b (POLYTRIM) ophthalmic solution Place 1 drop into the left eye every 4 (four) hours. Patient not taking: Reported on 01/06/2016 02/06/15   Merryl Hacker, MD    Family History Family History  Problem Relation Age of Onset  . Gout  Other   . Diabetes Other     Social History Social History  Substance Use Topics  . Smoking status: Never Smoker  . Smokeless tobacco: Never Used  . Alcohol use No     Allergies   Codeine and Penicillins   Review of Systems Review of Systems  Constitutional: Negative for fever.  Respiratory: Negative for cough and shortness of breath.   Cardiovascular: Positive for chest pain.  Gastrointestinal: Negative for abdominal pain, anal bleeding and constipation.  Genitourinary: Positive for  vaginal bleeding. Negative for pelvic pain and vaginal pain.  Neurological: Negative for dizziness and light-headedness.  All other systems reviewed and are negative.    Physical Exam Updated Vital Signs BP 153/95 (BP Location: Left Arm)   Pulse 99   Temp 98.8 F (37.1 C) (Oral)   Resp 18   Ht 5' (1.524 m)   Wt 164 lb (74.4 kg)   SpO2 99%   BMI 32.03 kg/m   Physical Exam  Constitutional: She appears well-developed and well-nourished. No distress.  HENT:  Head: Normocephalic and atraumatic.  Neck: Neck supple.  Cardiovascular: Normal rate and regular rhythm.   Pulmonary/Chest: Effort normal and breath sounds normal. No respiratory distress. She has no wheezes. She has no rales.  Abdominal: Soft. She exhibits no distension. There is no tenderness. There is no rebound and no guarding.  Genitourinary:  Genitourinary Comments: Small amount of yellow discharge in vagina.  Cervix nontender.  No blood.  No foreign body or injury noted.  Bimanual without mass or tenderness.  Exam somewhat limited secondary to patient's body habitus.    Neurological: She is alert.  Skin: She is not diaphoretic.  Nursing note and vitals reviewed.    ED Treatments / Results  DIAGNOSTIC STUDIES: Oxygen Saturation is 99% on RA, normal by my interpretation.    COORDINATION OF CARE: 10:50 PM Discussed treatment plan with pt at bedside and pt agreed to plan. Pt prepared for pelvic exam. Will reassess.  Labs (all labs ordered are listed, but only abnormal results are displayed) Labs Reviewed  WET PREP, GENITAL - Abnormal; Notable for the following:       Result Value   Clue Cells Wet Prep HPF POC PRESENT (*)    WBC, Wet Prep HPF POC FEW (*)    All other components within normal limits  BASIC METABOLIC PANEL - Abnormal; Notable for the following:    Sodium 133 (*)    Chloride 98 (*)    Glucose, Bld 359 (*)    Calcium 8.8 (*)    All other components within normal limits  CBC WITH  DIFFERENTIAL/PLATELET - Abnormal; Notable for the following:    WBC 12.8 (*)    Lymphs Abs 4.6 (*)    Monocytes Absolute 1.2 (*)    All other components within normal limits  URINALYSIS, ROUTINE W REFLEX MICROSCOPIC - Abnormal; Notable for the following:    APPearance HAZY (*)    Specific Gravity, Urine 1.033 (*)    Glucose, UA >=500 (*)    Leukocytes, UA TRACE (*)    Bacteria, UA RARE (*)    Squamous Epithelial / LPF 0-5 (*)    All other components within normal limits  CBG MONITORING, ED - Abnormal; Notable for the following:    Glucose-Capillary 303 (*)    All other components within normal limits  CBG MONITORING, ED - Abnormal; Notable for the following:    Glucose-Capillary 353 (*)    All other components within normal  limits  URINE CULTURE  RPR  HIV ANTIBODY (ROUTINE TESTING)  I-STAT BETA HCG BLOOD, ED ([facility], [facility], [facility] ONLY)  GC/CHLAMYDIA PROBE AMP ([place]) NOT AT [facility]    EKG  EKG Interpretation None       Radiology US Transvaginal Non-ob  Result Date: 03/04/2016 CLINICAL DATA:  Intermittent vaginal bleeding over 1 week. Ablation 14 years ago. EXAM: TRANSABDOMINAL AND TRANSVAGINAL ULTRASOUND OF PELVIS TECHNIQUE: Both transabdominal and transvaginal ultrasound examinations of the pelvis were performed. Transabdominal technique was performed for global imaging of the pelvis including uterus, ovaries, adnexal regions, and pelvic cul-de-sac. It was necessary to proceed with endovaginal exam following the transabdominal exam to visualize the endometrium and ovaries. COMPARISON:  03/04/2014 FINDINGS: Uterus Measurements: 8.6 x 5.1 x 5.2 cm. Uterus is anteverted. No fibroids or other mass visualized. Nabothian cysts in the cervix. Endometrium Thickness: 12.1 mm. A small amount of fluid is demonstrated the endometrial canal. There is eccentric filling defect within the endometrium suggesting a polyp and measuring about 1.1 x 0.8 cm. Right ovary Measurements: 4.4 x 3.2 x 3.6 cm.  Normal appearance/no adnexal mass. Follicular cysts are present. Left ovary Measurements: 3.7 x 2.3 x 2 cm. Normal appearance/no adnexal mass. Follicular cysts are present. Other findings Small amount of free fluid in the pelvis. IMPRESSION: Borderline endometrial thickness with fluid in the endometrium and eccentric filling defects suggesting follow-up measuring 1.1 cm maximal diameter. Normal follicular cystic changes in the ovaries. Electronically Signed   By: Lucienne Capers M.D.   On: 03/04/2016 00:34   US Pelvis Complete  Result Date: 03/04/2016 CLINICAL DATA:  Intermittent vaginal bleeding over 1 week. Ablation 14 years ago. EXAM: TRANSABDOMINAL AND TRANSVAGINAL ULTRASOUND OF PELVIS TECHNIQUE: Both transabdominal and transvaginal ultrasound examinations of the pelvis were performed. Transabdominal technique was performed for global imaging of the pelvis including uterus, ovaries, adnexal regions, and pelvic cul-de-sac. It was necessary to proceed with endovaginal exam following the transabdominal exam to visualize the endometrium and ovaries. COMPARISON:  03/04/2014 FINDINGS: Uterus Measurements: 8.6 x 5.1 x 5.2 cm. Uterus is anteverted. No fibroids or other mass visualized. Nabothian cysts in the cervix. Endometrium Thickness: 12.1 mm. A small amount of fluid is demonstrated the endometrial canal. There is eccentric filling defect within the endometrium suggesting a polyp and measuring about 1.1 x 0.8 cm. Right ovary Measurements: 4.4 x 3.2 x 3.6 cm. Normal appearance/no adnexal mass. Follicular cysts are present. Left ovary Measurements: 3.7 x 2.3 x 2 cm. Normal appearance/no adnexal mass. Follicular cysts are present. Other findings Small amount of free fluid in the pelvis. IMPRESSION: Borderline endometrial thickness with fluid in the endometrium and eccentric filling defects suggesting follow-up measuring 1.1 cm maximal diameter. Normal follicular cystic changes in the ovaries. Electronically Signed    By: Lucienne Capers M.D.   On: 03/04/2016 00:34    Procedures Procedures (including critical care time)  Medications Ordered in ED Medications  sodium chloride 0.9 % bolus 1,000 mL (0 mLs Intravenous Stopped 03/04/16 0155)     Initial Impression / Assessment and Plan / ED Course  I have reviewed the triage vital signs and the nursing notes.  Pertinent labs & imaging results that were available during my care of the patient were reviewed by me and considered in my medical decision making (see chart for details).    Afebrile, nontoxic patient with c/o abnormal uterine bleeding without pain.  Found to have polyp and borderline endometrial thickness.  Advised GYN follow up.  Pt has also  not had a pap smear in many years, advised she needs this as well.  Discussed UA and wet prep results - pt not having symptoms c/w UTI or abnormal vaginal discharge - engaged in joint medical decision making and decided not to treat.  Pt agrees with urine culture - if positive, would do symptom check.  Pt is hyperglycemic and ate chips while in ED - this is because she has been out of her medications, will be able to refill tonight.  No DKA.  D/C home with refill of her home insulin, PCP and GYN follow up.   Discussed result, findings, treatment, and follow up  with patient.  Pt given return precautions.  Pt verbalizes understanding and agrees with plan.       I personally performed the services described in this documentation, which was scribed in my presence. The recorded information has been reviewed and is accurate.   Final Clinical Impressions(s) / ED Diagnoses   Final diagnoses:  Abnormal uterine bleeding  Hyperglycemia    New Prescriptions Discharge Medication List as of 03/04/2016  2:16 AM       Clayton Bibles, PA-C 03/04/16 0246    Veryl Speak, MD 03/04/16 [phone]

## 2016-03-03 NOTE — ED Triage Notes (Signed)
Pt reports she is experiencing intermittent vaginal bleeding that started about one week ago. Increased frequency. Pt is diabetic but reports she took last dose of her medication one week ago and does not have an appt to 2/21.

## 2016-03-03 NOTE — ED Notes (Signed)
US at bedside

## 2016-03-04 LAB — GC/CHLAMYDIA PROBE AMP (~~LOC~~) NOT AT ARMC
Chlamydia: NEGATIVE
NEISSERIA GONORRHEA: NEGATIVE

## 2016-03-04 LAB — WET PREP, GENITAL
Sperm: NONE SEEN
TRICH WET PREP: NONE SEEN
YEAST WET PREP: NONE SEEN

## 2016-03-04 LAB — CBG MONITORING, ED: Glucose-Capillary: 353 mg/dL — ABNORMAL HIGH (ref 65–99)

## 2016-03-04 MED ORDER — INSULIN DEGLUDEC 200 UNIT/ML ~~LOC~~ SOPN: 10.0000 [IU] | PEN_INJECTOR | Freq: Two times a day (BID) | SUBCUTANEOUS | 0 refills | Status: AC

## 2016-03-04 MED ORDER — INSULIN ASPART 100 UNIT/ML FLEXPEN: 15.0000 [IU] | PEN_INJECTOR | Freq: Three times a day (TID) | SUBCUTANEOUS | 0 refills | Status: AC

## 2016-03-04 NOTE — ED Notes (Signed)
Patient verbalizes understanding of discharge instructions, prescriptions, home care and follow up care. Patient out of department at this time. 

## 2016-03-04 NOTE — Discharge Instructions (Signed)
Read the information below.  You may return to the Emergency Department at any time for worsening condition or any new symptoms that concern you. °

## 2016-03-05 LAB — RPR: RPR Ser Ql: NONREACTIVE

## 2016-03-05 LAB — HIV ANTIBODY (ROUTINE TESTING W REFLEX): HIV Screen 4th Generation wRfx: NONREACTIVE

## 2016-03-06 LAB — URINE CULTURE: Culture: 40000 — AB

## 2016-03-07 ENCOUNTER — Telehealth: Payer: Self-pay

## 2016-03-07 NOTE — Progress Notes (Signed)
ED Antimicrobial Stewardship Positive Culture Follow Up   Melanie Luna is an 41 y.o. female who presented to [facility] on 03/03/2016 with a chief complaint of  Chief Complaint  Patient presents with  . Vaginal Bleeding    Recent Results (from the past 720 hour(s))  Wet prep, genital     Status: Abnormal   Collection Time: 03/03/16 10:49 PM  Result Value Ref Range Status   Yeast Wet Prep HPF POC NONE SEEN NONE SEEN Final   Trich, Wet Prep NONE SEEN NONE SEEN Final    Comment: Swab received with less than 0.5 mL of saline, saline added to specimen, interpret results with caution.   Clue Cells Wet Prep HPF POC PRESENT (A) NONE SEEN Final   WBC, Wet Prep HPF POC FEW (A) NONE SEEN Final   Sperm NONE SEEN  Final  Urine culture     Status: Abnormal   Collection Time: 03/03/16 10:49 PM  Result Value Ref Range Status   Specimen Description URINE, CLEAN CATCH  Final   Special Requests NONE  Final   Culture 40,000 COLONIES/mL ESCHERICHIA COLI (A)  Final   Report Status 03/06/2016 FINAL  Final   Organism ID, Bacteria ESCHERICHIA COLI (A)  Final      Susceptibility   Escherichia coli - MIC*    AMPICILLIN 8 SENSITIVE Sensitive     CEFAZOLIN <=4 SENSITIVE Sensitive     CEFTRIAXONE <=1 SENSITIVE Sensitive     CIPROFLOXACIN >=4 RESISTANT Resistant     GENTAMICIN <=1 SENSITIVE Sensitive     IMIPENEM <=0.25 SENSITIVE Sensitive     NITROFURANTOIN <=16 SENSITIVE Sensitive     TRIMETH/SULFA <=20 SENSITIVE Sensitive     AMPICILLIN/SULBACTAM <=2 SENSITIVE Sensitive     PIP/TAZO <=4 SENSITIVE Sensitive     Extended ESBL NEGATIVE Sensitive     * 40,000 COLONIES/mL ESCHERICHIA COLI    []  Treated with N/A, organism resistant to prescribed antimicrobial [x]  Patient discharged originally without antimicrobial agent and treatment is now indicated  New antibiotic prescription: IF symptomatic, start keflex 500mg  PO BID x 7 days  ED Provider: Reece Agar, PA   [place], Rande Lawman 03/07/2016,  10:54 AM Clinical Pharmacist Phone# [phone]

## 2016-03-11 ENCOUNTER — Telehealth (HOSPITAL_BASED_OUTPATIENT_CLINIC_OR_DEPARTMENT_OTHER): Payer: Self-pay

## 2016-03-11 NOTE — Telephone Encounter (Signed)
Pt called after receiving letter.  Pt reports no urinary symptoms.  Rx for Keflex not called in.

## 2016-04-21 ENCOUNTER — Telehealth: Payer: Self-pay | Admitting: *Deleted

## 2016-04-21 ENCOUNTER — Inpatient Hospital Stay (HOSPITAL_COMMUNITY)
Admission: AD | Admit: 2016-04-21 | Discharge: 2016-04-21 | Disposition: A | Discharge status: Home / Self Care | Payer: Medicaid Other | Source: Ambulatory Visit | Attending: Family Medicine | Admitting: Family Medicine

## 2016-04-21 ENCOUNTER — Encounter (HOSPITAL_COMMUNITY): Payer: Self-pay | Admitting: *Deleted

## 2016-04-21 DIAGNOSIS — N938 Other specified abnormal uterine and vaginal bleeding: Secondary | ICD-10-CM | POA: Diagnosis not present

## 2016-04-21 DIAGNOSIS — N939 Abnormal uterine and vaginal bleeding, unspecified: Secondary | ICD-10-CM | POA: Diagnosis present

## 2016-04-21 DIAGNOSIS — Z880 Allergy status to penicillin: Secondary | ICD-10-CM | POA: Insufficient documentation

## 2016-04-21 DIAGNOSIS — Z7982 Long term (current) use of aspirin: Secondary | ICD-10-CM | POA: Diagnosis not present

## 2016-04-21 DIAGNOSIS — R300 Dysuria: Secondary | ICD-10-CM | POA: Diagnosis not present

## 2016-04-21 LAB — URINALYSIS, ROUTINE W REFLEX MICROSCOPIC
Bilirubin Urine: NEGATIVE
Ketones, ur: NEGATIVE mg/dL
Nitrite: NEGATIVE
PH: 5 (ref 5.0–8.0)
PROTEIN: 30 mg/dL — AB
Specific Gravity, Urine: 1.026 (ref 1.005–1.030)

## 2016-04-21 LAB — WET PREP, GENITAL
CLUE CELLS WET PREP: NONE SEEN
Sperm: NONE SEEN
TRICH WET PREP: NONE SEEN
WBC WET PREP: NONE SEEN
Yeast Wet Prep HPF POC: NONE SEEN

## 2016-04-21 LAB — POCT PREGNANCY, URINE: PREG TEST UR: NEGATIVE

## 2016-04-21 MED ORDER — PHENAZOPYRIDINE HCL 200 MG PO TABS: 200.0000 mg | ORAL_TABLET | Freq: Three times a day (TID) | ORAL | 0 refills | Status: DC | PRN

## 2016-04-21 MED ORDER — MEGESTROL ACETATE 40 MG PO TABS: 40.0000 mg | ORAL_TABLET | Freq: Two times a day (BID) | ORAL | 0 refills | Status: DC

## 2016-04-21 NOTE — Discharge Instructions (Signed)
Dysfunctional Uterine Bleeding Dysfunctional uterine bleeding is abnormal bleeding from the uterus. Dysfunctional uterine bleeding includes:  A period that comes earlier or later than usual.  A period that is lighter, heavier, or has blood clots.  Bleeding between periods.  Skipping one or more periods.  Bleeding after sexual intercourse.  Bleeding after menopause. Follow these instructions at home: Pay attention to any changes in your symptoms. Follow these instructions to help with your condition: Eating and drinking   Eat well-balanced meals. Include foods that are high in iron, such as liver, meat, shellfish, green leafy vegetables, and eggs.  If you become constipated:  Drink plenty of water.  Eat fruits and vegetables that are high in water and fiber, such as spinach, carrots, raspberries, apples, and mango. Medicines   Take over-the-counter and prescription medicines only as told by your health care provider.  Do not change medicines without talking with your health care provider.  Aspirin or medicines that contain aspirin may make the bleeding worse. Do not take those medicines:  During the week before your period.  During your period.  If you were prescribed iron pills, take them as told by your health care provider. Iron pills help to replace iron that your body loses because of this condition. Activity   If you need to change your sanitary pad or tampon more than one time every 2 hours:  Lie in bed with your feet raised (elevated).  Place a cold pack on your lower abdomen.  Rest as much as possible until the bleeding stops or slows down.  Do not try to lose weight until the bleeding has stopped and your blood iron level is back to normal. Other Instructions   For two months, write down:  When your period starts.  When your period ends.  When any abnormal bleeding occurs.  What problems you notice.  Keep all follow up visits as told by your  health care provider. This is important. Contact a health care provider if:  You get light-headed or weak.  You have nausea and vomiting.  You cannot eat or drink without vomiting.  You feel dizzy or have diarrhea while you are taking medicines.  You are taking birth control pills or hormones, and you want to change them or stop taking them. Get help right away if:  You develop a fever or chills.  You need to change your sanitary pad or tampon more than one time per hour.  Your bleeding becomes heavier, or your flow contains clots more often.  You develop pain in your abdomen.  You lose consciousness.  You develop a rash. This information is not intended to replace advice given to you by your health care provider. Make sure you discuss any questions you have with your health care provider. Document Released: 01/09/2000 Document Revised: 06/19/2015 Document Reviewed: 04/08/2014 Elsevier Interactive Patient Education  2017 Elsevier Inc.  

## 2016-04-21 NOTE — Telephone Encounter (Addendum)
Called pt per request of Maye Hides, CNM.  Pt needs to be informed of appt for Vaginal [place] scheduled on 4/5 @ 1530 (arrive @ 1515). She will also receive a call from our scheduling staff with information about a follow up appt in our office. She will have evaluation of the abnormal bleeding, receive the [place] results and most likely an endometrial biopsy at the appt in our office.  Pre-auth for the [place] appt has been completed via [employer]. Message has been sent to front office staff for appt scheduling.

## 2016-04-21 NOTE — MAU Provider Note (Signed)
Melanie Luna is a 41 year old [id number] here with complaints of abnormal uterine bleeding since January 2018. She thinks it is because she is allergic to spermicide, and that her partner has been using spermicide with another woman.   She has been seen by a physician at [facility].  Her medical history is significant for ablation and  Tubal ligation. She was recently seen in the [place] ED for hyperglycemia and abnormal uterine bleeding (03-03-2016); [place] on that date found a possible polyp.   She has an appointment on Friday 3-30 at [facility] to talk with her doctor about next steps to manage her uterine bleeding and a possible endometrial biopsy.   She also complains of pain after voiding, but not urgency. She has taken keflex and macrobid recently for a UTI.  History     CSN: [id number]  Arrival date and time: 04/21/16 0355   None     Chief Complaint  Patient presents with  . Vaginal Bleeding   Vaginal Bleeding  The patient's primary symptoms include vaginal bleeding. The patient's pertinent negatives include no genital itching, genital lesions or genital odor. This is a chronic problem. The current episode started more than 1 month ago. The problem occurs intermittently. The problem has been unchanged. Associated symptoms include abdominal pain, dysuria and flank pain. The vaginal bleeding is typical of menses. She has been passing clots. She has not been passing tissue. She uses tubal ligation for contraception.  Dysuria   This is a chronic problem. The current episode started in the past 7 days. The problem has been gradually improving. The pain is at a severity of 4/10. There has been no fever. She is sexually active. Associated symptoms include flank pain.   Patient is unable to give a clear timeline of the results of when she was diagnosed with her previous two UTI but she does know that she was treated with Keflex and then [place] recently. She states that her doctor at [facility] called  her to change her to MAcrobid. She finished the Macrobid on Saturday but continues to have pain at the completion of urination. She does not have any other abdominal pain, just the pain at the completion of voiding.  OB History    Gravida Para Term Preterm AB Living   4       1 3    SAB TAB Ectopic Multiple Live Births     1     3      Past Medical History:  Diagnosis Date  . Diabetes mellitus   . Hypertension     Past Surgical History:  Procedure Laterality Date  . CESAREAN SECTION    . TUBAL LIGATION      Family History  Problem Relation Age of Onset  . Gout Other   . Diabetes Other     Social History  Substance Use Topics  . Smoking status: Never Smoker  . Smokeless tobacco: Never Used  . Alcohol use No    Allergies:  Allergies  Allergen Reactions  . Codeine Other (See Comments)    Reaction:  Seizures   . Penicillins Other (See Comments)    Reaction:  Seizures     Prescriptions Prior to Admission  Medication Sig Dispense Refill Last Dose  . aspirin EC 81 MG tablet Take 81 mg by mouth daily.   01/06/2016 at 0830  . cephALEXin (KEFLEX) 500 MG capsule Take 1 capsule (500 mg total) by mouth 2 (two) times daily. 28 capsule 0   .  ciprofloxacin (CIPRO) 500 MG tablet Take 1 tablet (500 mg total) by mouth 2 (two) times daily. (Patient not taking: Reported on 02/11/2014) 14 tablet 0 Not Taking  . ibuprofen (ADVIL,MOTRIN) 800 MG tablet Take 1 tablet (800 mg total) by mouth 3 (three) times daily. (Patient not taking: Reported on 02/11/2014) 21 tablet 0 Not Taking  . insulin aspart (NOVOLOG FLEXPEN) 100 UNIT/ML FlexPen Inject 15 Units into the skin 3 (three) times daily with meals. 15 mL 0   . Insulin Degludec (TRESIBA FLEXTOUCH) 200 UNIT/ML SOPN Inject 10 Units into the skin 2 (two) times daily. 3 mL 0   . metroNIDAZOLE (FLAGYL) 500 MG tablet Take 1 tablet (500 mg total) by mouth 3 (three) times daily. (Patient not taking: Reported on 02/11/2014) 14 tablet 0 Not Taking  .  ondansetron (ZOFRAN ODT) 4 MG disintegrating tablet Take 1 tablet (4 mg total) by mouth every 8 (eight) hours as needed for nausea or vomiting. 20 tablet 0   . trimethoprim-polymyxin b (POLYTRIM) ophthalmic solution Place 1 drop into the left eye every 4 (four) hours. (Patient not taking: Reported on 01/06/2016) 10 mL 0 Not Taking at Unknown time    Review of Systems  Gastrointestinal: Positive for abdominal pain.  Genitourinary: Positive for dysuria, flank pain and vaginal bleeding.   Physical Exam   Blood pressure (!) 139/95, pulse 88, temperature 98 F (36.7 C), temperature source Oral, resp. rate 20, height 5' (1.524 m), weight 77.3 kg (170 lb 8 oz), last menstrual period 04/21/2001.  Physical Exam  Constitutional: She appears well-developed.  HENT:  Head: Normocephalic.  Neck: Normal range of motion.  Respiratory: Effort normal. No respiratory distress.  GI: Soft. She exhibits no distension and no mass. There is no tenderness. There is no rebound and no guarding.  Genitourinary:  Genitourinary Comments: NEFG; dark brown clots visualized in the vagina. Cervix is pink with no lesions, vaginal walls pink with no lesions. No CMT, no adnexal tenderness or suprapubic tenderness.   Musculoskeletal: Normal range of motion.  Skin: Skin is warm and dry.    MAU Course  Procedures  MDM UA: normal, large leuks but no nitrites UC pending GC Ct pending Wet prep: normal   Assessment and Plan   1. Dysfunctional uterine bleeding     2. Patient stable for discharge with RX for pyridium and Megace.  Patient knows that we will call her with the results of her urine culture. 3.  Message sent to clinic to schedule outpatient GYN ultrasound and appt with MD for an endometrial biopsy.   Melanie Luna Melanie Luna CNM 04/21/2016, 4:51 AM

## 2016-04-21 NOTE — MAU Note (Signed)
PT SAYS DR  Seymour Bars  AT [facility]   HAD  UTI ON    3-13 - GAVE MACROBID- FINISHED  ON Sunday.        THEN  AFTER  MEDS ARE FINISHED SHE FEELS PAIN  WITH VOIDING . YESTERDAY HAD  SPOTTING- THINKS  FROM  VAG . THEN AT 147 AM  HAD HEAVY BLEEDING.-   LOWER BACK AND  RIGHT SIDE  HURTS.    PAD  ON IN TRIAGE-     SMALL AMT  LIGHT  RED.      HAD BTL - 2003.    LAST SEX-  SAT

## 2016-04-22 LAB — GC/CHLAMYDIA PROBE AMP (~~LOC~~) NOT AT ARMC
Chlamydia: NEGATIVE
NEISSERIA GONORRHEA: NEGATIVE

## 2016-04-22 NOTE — Telephone Encounter (Signed)
Called patient and informed her of ultrasound appt. Patient verbalized understanding and states she cannot make that day. Provided ultrasound's number for her to reschedule and discussed that someone from our office will be contacting her with an appt here. Patient verbalized understanding and asked if you can have multiple ablations. Told patient that is possible but a provider will speak with her at her appt about treatment options. Patient verbalized understanding to all & had no questions

## 2016-04-23 LAB — URINE CULTURE: Culture: >=100000 — AB

## 2016-04-24 ENCOUNTER — Telehealth: Payer: Self-pay | Admitting: Student

## 2016-04-24 DIAGNOSIS — N390 Urinary tract infection, site not specified: Principal | ICD-10-CM

## 2016-04-24 DIAGNOSIS — B9620 Unspecified Escherichia coli [E. coli] as the cause of diseases classified elsewhere: Secondary | ICD-10-CM

## 2016-04-24 MED ORDER — SULFAMETHOXAZOLE-TRIMETHOPRIM 800-160 MG PO TABS
1.0000 | ORAL_TABLET | Freq: Two times a day (BID) | ORAL | 0 refills | Status: DC
Start: 2016-04-24 — End: 2016-06-22

## 2016-04-24 NOTE — Telephone Encounter (Signed)
Verified identity by name & DOB. Discussed results of urine culture. Will rx bactrim. To f/u with PCP after antibiotics for TOC d/t recurrent UTIs this year. No further questions.

## 2016-04-29 ENCOUNTER — Ambulatory Visit (HOSPITAL_COMMUNITY): Payer: Medicaid Other | Attending: Student

## 2016-04-29 ENCOUNTER — Encounter (HOSPITAL_COMMUNITY): Payer: Self-pay

## 2016-05-10 ENCOUNTER — Encounter: Payer: Medicaid Other | Admitting: Obstetrics & Gynecology

## 2016-05-13 ENCOUNTER — Ambulatory Visit (HOSPITAL_COMMUNITY)
Admission: RE | Admit: 2016-05-13 | Discharge: 2016-05-13 | Disposition: A | Discharge status: Home / Self Care | Payer: Medicaid Other | Source: Ambulatory Visit | Attending: Student | Admitting: Student

## 2016-05-13 DIAGNOSIS — N938 Other specified abnormal uterine and vaginal bleeding: Secondary | ICD-10-CM | POA: Diagnosis present

## 2016-05-13 DIAGNOSIS — D259 Leiomyoma of uterus, unspecified: Secondary | ICD-10-CM | POA: Insufficient documentation

## 2016-05-13 DIAGNOSIS — R938 Abnormal findings on diagnostic imaging of other specified body structures: Secondary | ICD-10-CM | POA: Insufficient documentation

## 2016-05-26 ENCOUNTER — Other Ambulatory Visit (HOSPITAL_COMMUNITY)
Admission: RE | Admit: 2016-05-26 | Discharge: 2016-05-26 | Disposition: A | Discharge status: Home / Self Care | Payer: Medicaid Other | Source: Ambulatory Visit | Attending: Obstetrics & Gynecology | Admitting: Obstetrics & Gynecology

## 2016-05-26 ENCOUNTER — Encounter: Payer: Self-pay | Admitting: Obstetrics & Gynecology

## 2016-05-26 ENCOUNTER — Ambulatory Visit (INDEPENDENT_AMBULATORY_CARE_PROVIDER_SITE_OTHER): Payer: Medicaid Other | Admitting: Obstetrics & Gynecology

## 2016-05-26 ENCOUNTER — Encounter (HOSPITAL_COMMUNITY): Payer: Self-pay

## 2016-05-26 VITALS — BP 125/85 | HR 99 | Wt 165.8 lb

## 2016-05-26 DIAGNOSIS — N760 Acute vaginitis: Secondary | ICD-10-CM | POA: Insufficient documentation

## 2016-05-26 DIAGNOSIS — B9689 Other specified bacterial agents as the cause of diseases classified elsewhere: Secondary | ICD-10-CM | POA: Insufficient documentation

## 2016-05-26 DIAGNOSIS — N898 Other specified noninflammatory disorders of vagina: Secondary | ICD-10-CM

## 2016-05-26 DIAGNOSIS — N938 Other specified abnormal uterine and vaginal bleeding: Secondary | ICD-10-CM

## 2016-05-26 DIAGNOSIS — B373 Candidiasis of vulva and vagina: Secondary | ICD-10-CM | POA: Insufficient documentation

## 2016-05-26 DIAGNOSIS — Z0000 Encounter for general adult medical examination without abnormal findings: Secondary | ICD-10-CM

## 2016-05-26 MED ORDER — METRONIDAZOLE 500 MG PO TABS: 500.0000 mg | ORAL_TABLET | Freq: Two times a day (BID) | ORAL | 0 refills | Status: DC

## 2016-05-26 MED ORDER — MEGESTROL ACETATE 40 MG PO TABS: 40.0000 mg | ORAL_TABLET | Freq: Two times a day (BID) | ORAL | 0 refills | Status: DC

## 2016-05-26 NOTE — Progress Notes (Signed)
   Subjective:    Patient ID: Melanie Luna, female    DOB: 06-22-1975, 41 y.o.   MRN: [id number]  HPI 41 yo S AA P3 here with new onset vaginal bleeding. She had a Novasure ablation 15 years ago in 2003 and had no more vaginal bleeding until just recently.   Review of Systems     Objective:   Physical Exam [id number] Breathing, conversing, and ambulating normally Abd- benign Vagina with a small cystocele, Grade 1 uterine prolapse Vaginal discharge c/w BV  IMPRESSION: 1. Question septated uterus. 2.8 cm right posterior fibroid. 2.5 cm fundal lesion abutting the endometrium, most likely a fibroid. A focal endometrial lesion cannot be excluded. Endometrium is irregular. Endometrial thickness is difficult to evaluate due to the adjacent fibroid/endometrial lesion and small amount of fluid in the endometrial canal. A focal endometrial lesion cannot be excluded. Consider sonohysterogram forfurther evaluation, prior to hysteroscopy or endometrial biopsy.  2. No focal ovarian abnormality.  No free pelvic fluid .    Assessment & Plan:  Preventative- pap smear with cotesting Vaginal discharge- wet prep sent Treat for BV with flagyl and rec boric acid Email sent to [facility] to schedule a hysteroscopy with d&c

## 2016-05-31 LAB — CYTOLOGY - PAP
Bacterial vaginitis: POSITIVE — AB
CANDIDA VAGINITIS: POSITIVE — AB
CHLAMYDIA, DNA PROBE: NEGATIVE
Diagnosis: NEGATIVE
HPV (WINDOPATH): NOT DETECTED
NEISSERIA GONORRHEA: NEGATIVE
TRICH (WINDOWPATH): NEGATIVE

## 2016-06-02 ENCOUNTER — Telehealth: Payer: Self-pay | Admitting: *Deleted

## 2016-06-02 MED ORDER — FLUCONAZOLE 150 MG PO TABS: 150.0000 mg | ORAL_TABLET | Freq: Once | ORAL | 3 refills | Status: DC

## 2016-06-02 NOTE — Telephone Encounter (Signed)
Called pt and informed her of pap result normal. Also her testing showed +BV and + vaginal yeast.  Rx sent to pharmacy for diflucan as prescribed by Dr. Hulan Fray. Pt advised that she may take 2nd dose of medication if she still has sx 3 days after 1st dose. Pt confirms she is taking Metronidazole as prescribed on 5/2.  Pt voiced understanding of all information and instructions given.

## 2016-06-15 ENCOUNTER — Telehealth: Payer: Self-pay | Admitting: Family Medicine

## 2016-06-15 NOTE — Telephone Encounter (Signed)
Patient called because she had questions about getting surgery on 05/29. Was upset because she had not received a call back. Dee agreed to call her back today before 5pm.

## 2016-06-15 NOTE — Telephone Encounter (Signed)
Called patient voice mail came on patient can not accept calls at this time. I have left a message for her to call us back.

## 2016-06-17 NOTE — Patient Instructions (Addendum)
Your procedure is scheduled on:  Tuesday, Jun 22, 2016  Enter through the [employer] [facility] at:  1:15 PM  Pick up the phone at the desk and dial [phone].  Call this number if you have problems the morning of surgery: [phone].  Remember: Do NOT eat food:  After 6:30 AM day of surgery (light meal no meats, no grits, toast is fine)  Do NOT drink clear liquids after:  10:30 AM day of surgery  Take these medicines the morning of surgery with a SIP OF WATER:  Atorvastatin, Lisinopril  Stop ALL herbal medications at this time  Do NOT smoke the day of surgery.  Do NOT wear jewelry (body piercing), metal hair clips/bobby pins, make-up, or nail polish. Do NOT wear lotions, powders, or perfumes.  You may wear deodorant. Do NOT shave for 48 hours prior to surgery. Do NOT bring valuables to the hospital. Contacts, dentures, or bridgework may not be worn into surgery.  Have a responsible adult drive you home and stay with you for 24 hours after your procedure  Bring a copy of your healthcare power of attorney and living will documents.

## 2016-06-18 ENCOUNTER — Encounter (HOSPITAL_COMMUNITY): Payer: Self-pay

## 2016-06-18 ENCOUNTER — Encounter (HOSPITAL_COMMUNITY)
Admission: RE | Admit: 2016-06-18 | Discharge: 2016-06-18 | Disposition: A | Discharge status: Home / Self Care | Payer: Medicaid Other | Source: Ambulatory Visit | Attending: Obstetrics & Gynecology | Admitting: Obstetrics & Gynecology

## 2016-06-18 DIAGNOSIS — E119 Type 2 diabetes mellitus without complications: Secondary | ICD-10-CM | POA: Insufficient documentation

## 2016-06-18 DIAGNOSIS — I10 Essential (primary) hypertension: Secondary | ICD-10-CM | POA: Diagnosis not present

## 2016-06-18 DIAGNOSIS — Z01812 Encounter for preprocedural laboratory examination: Secondary | ICD-10-CM | POA: Diagnosis not present

## 2016-06-18 DIAGNOSIS — G4700 Insomnia, unspecified: Secondary | ICD-10-CM | POA: Insufficient documentation

## 2016-06-18 HISTORY — DX: Insomnia, unspecified: G47.00

## 2016-06-18 LAB — BASIC METABOLIC PANEL
ANION GAP: 7 (ref 5–15)
BUN: 10 mg/dL (ref 6–20)
CALCIUM: 9.3 mg/dL (ref 8.9–10.3)
CO2: 27 mmol/L (ref 22–32)
Chloride: 101 mmol/L (ref 101–111)
Creatinine, Ser: 0.59 mg/dL (ref 0.44–1.00)
GFR calc Af Amer: >60 mL/min (ref >60)
Glucose, Bld: 245 mg/dL — ABNORMAL HIGH (ref 65–99)
POTASSIUM: 4 mmol/L (ref 3.5–5.1)
SODIUM: 135 mmol/L (ref 135–145)

## 2016-06-18 LAB — CBC
HCT: 38.5 % (ref 36.0–46.0)
Hemoglobin: 12.8 g/dL (ref 12.0–15.0)
MCH: 27.1 pg (ref 26.0–34.0)
MCHC: 33.2 g/dL (ref 30.0–36.0)
MCV: 81.4 fL (ref 78.0–100.0)
Platelets: 283 10*3/uL (ref 150–400)
RBC: 4.73 MIL/uL (ref 3.87–5.11)
RDW: 12.8 % (ref 11.5–15.5)
WBC: 9.5 10*3/uL (ref 4.0–10.5)

## 2016-06-18 NOTE — Pre-Procedure Instructions (Signed)
I spoke with Dr. Jillyn Hidden about diabetic medication management, he recommends to take insulin per normal routine with just a light breakfast of toast, no meat, no grits before 6:30 AM.

## 2016-06-18 NOTE — Pre-Procedure Instructions (Signed)
Dr. Jillyn Hidden was made aware of Melanie Luna's elevated glucose today, no new orders received at this time.

## 2016-06-22 ENCOUNTER — Ambulatory Visit (HOSPITAL_COMMUNITY): Payer: Medicaid Other | Admitting: Anesthesiology

## 2016-06-22 ENCOUNTER — Ambulatory Visit (HOSPITAL_COMMUNITY)
Admission: RE | Admit: 2016-06-22 | Discharge: 2016-06-22 | Disposition: A | Discharge status: Home / Self Care | Payer: Medicaid Other | Source: Ambulatory Visit | Attending: Obstetrics & Gynecology | Admitting: Obstetrics & Gynecology

## 2016-06-22 ENCOUNTER — Encounter (HOSPITAL_COMMUNITY)
Admission: RE | Disposition: A | Discharge status: Home / Self Care | Payer: Self-pay | Source: Ambulatory Visit | Attending: Obstetrics & Gynecology

## 2016-06-22 ENCOUNTER — Encounter (HOSPITAL_COMMUNITY): Payer: Self-pay

## 2016-06-22 DIAGNOSIS — E119 Type 2 diabetes mellitus without complications: Secondary | ICD-10-CM | POA: Diagnosis not present

## 2016-06-22 DIAGNOSIS — Q512 Other doubling of uterus: Secondary | ICD-10-CM | POA: Diagnosis not present

## 2016-06-22 DIAGNOSIS — G4700 Insomnia, unspecified: Secondary | ICD-10-CM | POA: Diagnosis not present

## 2016-06-22 DIAGNOSIS — N920 Excessive and frequent menstruation with regular cycle: Secondary | ICD-10-CM | POA: Insufficient documentation

## 2016-06-22 DIAGNOSIS — Z9851 Tubal ligation status: Secondary | ICD-10-CM | POA: Diagnosis not present

## 2016-06-22 DIAGNOSIS — N938 Other specified abnormal uterine and vaginal bleeding: Secondary | ICD-10-CM

## 2016-06-22 DIAGNOSIS — Z880 Allergy status to penicillin: Secondary | ICD-10-CM | POA: Diagnosis not present

## 2016-06-22 DIAGNOSIS — Z794 Long term (current) use of insulin: Secondary | ICD-10-CM | POA: Insufficient documentation

## 2016-06-22 DIAGNOSIS — I10 Essential (primary) hypertension: Secondary | ICD-10-CM | POA: Insufficient documentation

## 2016-06-22 DIAGNOSIS — Z833 Family history of diabetes mellitus: Secondary | ICD-10-CM | POA: Diagnosis not present

## 2016-06-22 HISTORY — PX: HYSTEROSCOPY W/D&C: SHX1775

## 2016-06-22 LAB — GLUCOSE, CAPILLARY
Glucose-Capillary: 166 mg/dL — ABNORMAL HIGH (ref 65–99)
Glucose-Capillary: 94 mg/dL (ref 65–99)

## 2016-06-22 LAB — PREGNANCY, URINE: PREG TEST UR: NEGATIVE

## 2016-06-22 SURGERY — DILATATION AND CURETTAGE /HYSTEROSCOPY
Anesthesia: General | Site: Vagina

## 2016-06-22 MED ORDER — BUPIVACAINE HCL (PF) 0.5 % IJ SOLN
INTRAMUSCULAR | Status: DC | PRN
  Administered 2016-06-22: 30 mL

## 2016-06-22 MED ORDER — LIDOCAINE HCL (CARDIAC) 20 MG/ML IV SOLN
INTRAVENOUS | Status: DC | PRN
  Administered 2016-06-22: 80 mg via INTRAVENOUS

## 2016-06-22 MED ORDER — FENTANYL CITRATE (PF) 100 MCG/2ML IJ SOLN
INTRAMUSCULAR | Status: DC | PRN
  Administered 2016-06-22: 100 ug via INTRAVENOUS

## 2016-06-22 MED ORDER — PROMETHAZINE HCL 25 MG/ML IJ SOLN: 6.2500 mg | INTRAMUSCULAR | Status: DC | PRN

## 2016-06-22 MED ORDER — PROPOFOL 10 MG/ML IV BOLUS
INTRAVENOUS | Status: DC | PRN
  Administered 2016-06-22: 150 mg via INTRAVENOUS

## 2016-06-22 MED ORDER — LIDOCAINE HCL (PF) 1 % IJ SOLN
INTRAMUSCULAR | Status: AC
  Filled 2016-06-22: qty 5

## 2016-06-22 MED ORDER — ONDANSETRON HCL 4 MG/2ML IJ SOLN
INTRAMUSCULAR | Status: DC | PRN
  Administered 2016-06-22: 4 mg via INTRAVENOUS

## 2016-06-22 MED ORDER — LABETALOL HCL 5 MG/ML IV SOLN
INTRAVENOUS | Status: DC | PRN
  Administered 2016-06-22 (×3): 5 mg via INTRAVENOUS

## 2016-06-22 MED ORDER — FENTANYL CITRATE (PF) 100 MCG/2ML IJ SOLN: 25.0000 ug | INTRAMUSCULAR | Status: DC | PRN

## 2016-06-22 MED ORDER — OXYCODONE HCL 5 MG/5ML PO SOLN: 5.0000 mg | Freq: Once | ORAL | Status: DC | PRN

## 2016-06-22 MED ORDER — ONDANSETRON HCL 4 MG/2ML IJ SOLN
INTRAMUSCULAR | Status: AC
  Filled 2016-06-22: qty 2

## 2016-06-22 MED ORDER — OXYCODONE-ACETAMINOPHEN 5-325 MG PO TABS: 1.0000 | ORAL_TABLET | Freq: Four times a day (QID) | ORAL | 0 refills | Status: DC | PRN

## 2016-06-22 MED ORDER — SCOPOLAMINE 1 MG/3DAYS TD PT72
MEDICATED_PATCH | TRANSDERMAL | Status: AC
  Administered 2016-06-22: 1.5 mg via TRANSDERMAL
  Filled 2016-06-22: qty 1

## 2016-06-22 MED ORDER — SODIUM CHLORIDE 0.9 % IR SOLN
Status: DC | PRN
  Administered 2016-06-22: 3000 mL

## 2016-06-22 MED ORDER — EPHEDRINE SULFATE 50 MG/ML IJ SOLN
INTRAMUSCULAR | Status: DC | PRN
  Administered 2016-06-22: 10 mg via INTRAVENOUS

## 2016-06-22 MED ORDER — FENTANYL CITRATE (PF) 100 MCG/2ML IJ SOLN
INTRAMUSCULAR | Status: AC
  Filled 2016-06-22: qty 2

## 2016-06-22 MED ORDER — SUCCINYLCHOLINE CHLORIDE 200 MG/10ML IV SOSY
PREFILLED_SYRINGE | INTRAVENOUS | Status: AC
  Filled 2016-06-22: qty 10

## 2016-06-22 MED ORDER — MIDAZOLAM HCL 2 MG/2ML IJ SOLN
INTRAMUSCULAR | Status: DC | PRN
  Administered 2016-06-22: 2 mg via INTRAVENOUS

## 2016-06-22 MED ORDER — PROPOFOL 10 MG/ML IV BOLUS
INTRAVENOUS | Status: AC
  Filled 2016-06-22: qty 40

## 2016-06-22 MED ORDER — OXYCODONE HCL 5 MG PO TABS: 5.0000 mg | ORAL_TABLET | Freq: Once | ORAL | Status: DC | PRN

## 2016-06-22 MED ORDER — DEXAMETHASONE SODIUM PHOSPHATE 10 MG/ML IJ SOLN
INTRAMUSCULAR | Status: DC | PRN
  Administered 2016-06-22: 4 mg via INTRAVENOUS

## 2016-06-22 MED ORDER — LACTATED RINGERS IV SOLN
INTRAVENOUS | Status: DC
  Administered 2016-06-22: 125 mL/h via INTRAVENOUS
  Administered 2016-06-22 (×2): via INTRAVENOUS

## 2016-06-22 MED ORDER — EPHEDRINE 5 MG/ML INJ
INTRAVENOUS | Status: AC
  Filled 2016-06-22: qty 10

## 2016-06-22 MED ORDER — IBUPROFEN 600 MG PO TABS: 600.0000 mg | ORAL_TABLET | Freq: Four times a day (QID) | ORAL | 1 refills | Status: DC | PRN

## 2016-06-22 MED ORDER — MIDAZOLAM HCL 2 MG/2ML IJ SOLN
INTRAMUSCULAR | Status: AC
  Filled 2016-06-22: qty 2

## 2016-06-22 MED ORDER — SCOPOLAMINE 1 MG/3DAYS TD PT72
1.0000 | MEDICATED_PATCH | Freq: Once | TRANSDERMAL | Status: DC
  Administered 2016-06-22: 1.5 mg via TRANSDERMAL

## 2016-06-22 MED ORDER — MEPERIDINE HCL 25 MG/ML IJ SOLN: 6.2500 mg | INTRAMUSCULAR | Status: DC | PRN

## 2016-06-22 MED ORDER — SUCCINYLCHOLINE CHLORIDE 20 MG/ML IJ SOLN
INTRAMUSCULAR | Status: DC | PRN
  Administered 2016-06-22: 100 mg via INTRAVENOUS

## 2016-06-22 MED ORDER — BUPIVACAINE HCL (PF) 0.5 % IJ SOLN
INTRAMUSCULAR | Status: AC
  Filled 2016-06-22: qty 30

## 2016-06-22 SURGICAL SUPPLY — 19 items
BIPOLAR CUTTING LOOP 21FR (ELECTRODE)
CANISTER SUCT 3000ML PPV (MISCELLANEOUS) ×3 IMPLANT
CATH ROBINSON RED A/P 16FR (CATHETERS) ×3 IMPLANT
CLOTH BEACON ORANGE TIMEOUT ST (SAFETY) ×3 IMPLANT
CONTAINER PREFILL 10% NBF 60ML (FORM) ×3 IMPLANT
DILATOR CANAL MILEX (MISCELLANEOUS) IMPLANT
ELECT REM PT RETURN 9FT ADLT (ELECTROSURGICAL)
ELECTRODE REM PT RTRN 9FT ADLT (ELECTROSURGICAL) IMPLANT
GLOVE BIO SURGEON STRL SZ 6.5 (GLOVE) ×2 IMPLANT
GLOVE BIO SURGEONS STRL SZ 6.5 (GLOVE) ×1
GLOVE BIOGEL PI IND STRL 7.0 (GLOVE) ×1 IMPLANT
GLOVE BIOGEL PI INDICATOR 7.0 (GLOVE) ×2
GOWN STRL REUS W/TWL LRG LVL3 (GOWN DISPOSABLE) ×6 IMPLANT
LOOP CUTTING BIPOLAR 21FR (ELECTRODE) IMPLANT
PACK VAGINAL MINOR WOMEN LF (CUSTOM PROCEDURE TRAY) ×3 IMPLANT
PAD OB MATERNITY 4.3X12.25 (PERSONAL CARE ITEMS) ×3 IMPLANT
TOWEL OR 17X24 6PK STRL BLUE (TOWEL DISPOSABLE) ×6 IMPLANT
TUBING AQUILEX INFLOW (TUBING) ×3 IMPLANT
TUBING AQUILEX OUTFLOW (TUBING) ×3 IMPLANT

## 2016-06-22 NOTE — Anesthesia Postprocedure Evaluation (Addendum)
Anesthesia Post Note  Patient: Melanie Luna  Procedure(s) Performed: Procedure(s) (LRB): DILATATION AND CURETTAGE /HYSTEROSCOPY (N/A)  Patient location during evaluation: PACU Anesthesia Type: General Level of consciousness: sedated Pain management: pain level controlled Vital Signs Assessment: post-procedure vital signs reviewed and stable Respiratory status: spontaneous breathing and respiratory function stable Cardiovascular status: stable Anesthetic complications: no        Last Vitals:  Vitals:   06/22/16 1800 06/22/16 1815  BP: (!) 166/99 (!) 161/91  Pulse:  96  Resp:  (!) 22  Temp:  37.1 C    Last Pain:  Vitals:   06/22/16 1815  TempSrc:   PainSc: 2    Pain Goal: Patients Stated Pain Goal: 3 (06/22/16 1800)               SINGER,JAMES DANIEL

## 2016-06-22 NOTE — Op Note (Signed)
PRE-OPERATIVE DIAGNOSIS:  DISFUNCTIONAL UTERINE BLEEDING, MENORRHAGIA  POST-OPERATIVE DIAGNOSIS:  Same + septate uterus  PROCEDURE:  Procedure(s): DILATATION AND CURETTAGE /HYSTEROSCOPY (N/A)  SURGEON:  Surgeon(s) and Role:    * Ellyn Rubiano C, MD - Primary   ANESTHESIA:   local and general  EBL:  No intake/output data recorded.  BLOOD ADMINISTERED:none  DRAINS: none   LOCAL MEDICATIONS USED:  MARCAINE     SPECIMEN:  Source of Specimen:  uterine curettings  DISPOSITION OF SPECIMEN:  PATHOLOGY  COUNTS:  YES  TOURNIQUET:  * No tourniquets in log *  DICTATION: .Dragon Dictation  PLAN OF CARE: Discharge to home after PACU  PATIENT DISPOSITION:  PACU - hemodynamically stable.   Delay start of Pharmacological VTE agent (>24hrs) due to surgical blood loss or risk of bleeding: not applicable    The risks, benefits, and alternatives of surgery were explained, understood, and accepted. All questions were answered. Consents were signed. In the operating room general anesthesia was applied without complication, and she was placed in the dorsal lithotomy position. A time out procedure was done.  Her vagina was prepped and draped in the usual sterile fashion. A bimanual exam revealed a NSSA mobile uterus. Her adnexa were nonenlarged. A speculum was placed and a single-tooth tenaculum was used to grasp the anterior lip of her cervix. A total of 30 mL of 0.5% Marcaine was used to perform a paracervical block. Her uterus sounded to 10 cm. Her cervix was carefully and slowly dilated to accommodate a diagnostic hysteroscopy. Hysteroscopy revealed a almost entirely smooth, ablated endometrium. There were a few small area of shaggy endometrium. I was able to see a normal sizs cavity on her right and then an opening that led to a smaller cavity on her left. Surprisingly, the endometrium in this cavity also had the apprearance of a previously ablated endometrium. A curettage was done in all quadrants  and the fundus of the uterus using a small curette. I felt like I was able to enter the small cavity on her left. A small amount   tissue was obtained. A gritty sensation was appreciated throughout. There was no bleeding noted at the end of the case. She was taken to the recovery room after being extubated. She tolerated the procedure well.

## 2016-06-22 NOTE — Anesthesia Procedure Notes (Signed)
Procedure Name: Intubation Date/Time: 06/22/2016 3:37 PM Performed by: Hewitt Blade Pre-anesthesia Checklist: Patient identified, Emergency Drugs available, Suction available and Patient being monitored Patient Re-evaluated:Patient Re-evaluated prior to inductionOxygen Delivery Method: Circle system utilized Preoxygenation: Pre-oxygenation with 100% oxygen Intubation Type: IV induction Laryngoscope Size: Mac and 3 Grade View: Grade I Tube type: Oral Tube size: 7.0 mm Number of attempts: 1 Airway Equipment and Method: Stylet Placement Confirmation: ETT inserted through vocal cords under direct vision,  positive ETCO2 and breath sounds checked- equal and bilateral Secured at: 20 cm Tube secured with: Tape Dental Injury: Teeth and Oropharynx as per pre-operative assessment

## 2016-06-22 NOTE — Transfer of Care (Signed)
Immediate Anesthesia Transfer of Care Note  Patient: Melanie Luna  Procedure(s) Performed: Procedure(s): DILATATION AND CURETTAGE /HYSTEROSCOPY (N/A)  Patient Location: PACU  Anesthesia Type:General  Level of Consciousness: sedated  Airway & Oxygen Therapy: Patient Spontanous Breathing and Patient connected to nasal cannula oxygen  Post-op Assessment: Report given to RN, Post -op Vital signs reviewed and stable and Patient moving all extremities  Post vital signs: Reviewed and stable  Last Vitals:  Vitals:   06/22/16 1318  BP: (!) 142/94  Pulse: 99  Resp: 18  Temp: 36.8 C    Last Pain:  Vitals:   06/22/16 1318  TempSrc: Oral      Patients Stated Pain Goal: 3 (21/19/41 7408)  Complications: No apparent anesthesia complications

## 2016-06-22 NOTE — Discharge Instructions (Signed)
Dilation and Curettage or Vacuum Curettage, Care After °This sheet gives you information about how to care for yourself after your procedure. Your health care provider may also give you more specific instructions. If you have problems or questions, contact your health care provider. °What can I expect after the procedure? °After your procedure, it is common to have: °· Mild pain or cramping. °· Some vaginal bleeding or spotting. °These may last for up to 2 weeks after your procedure. °Follow these instructions at home: °Activity  ° °· Do not drive or use heavy machinery while taking prescription pain medicine. °· Avoid driving for the first 24 hours after your procedure. °· Take frequent, short walks, followed by rest periods, throughout the day. Ask your health care provider what activities are safe for you. After 1-2 days, you may be able to return to your normal activities. °· Do not lift anything heavier than 10 lb (4.5 kg) until your health care provider approves. °· For at least 2 weeks, or as long as told by your health care provider, do not: °¨ Douche. °¨ Use tampons. °¨ Have sexual intercourse. °General instructions  ° °· Take over-the-counter and prescription medicines only as told by your health care provider. This is especially important if you take blood thinning medicine. °· Do not take baths, swim, or use a hot tub until your health care provider approves. Take showers instead of baths. °· Wear compression stockings as told by your health care provider. These stockings help to prevent blood clots and reduce swelling in your legs. °· It is your responsibility to get the results of your procedure. Ask your health care provider, or the department performing the procedure, when your results will be ready. °· Keep all follow-up visits as told by your health care provider. This is important. °Contact a health care provider if: °· You have severe cramps that get worse or that do not get better with  medicine. °· You have severe abdominal pain. °· You cannot drink fluids without vomiting. °· You develop pain in a different area of your pelvis. °· You have bad-smelling vaginal discharge. °· You have a rash. °Get help right away if: °· You have vaginal bleeding that soaks more than one sanitary pad in 1 hour, for 2 hours in a row. °· You pass large blood clots from your vagina. °· You have a fever that is above 100.4°F (38.0°C). °· Your abdomen feels very tender or hard. °· You have chest pain. °· You have shortness of breath. °· You cough up blood. °· You feel dizzy or light-headed. °· You faint. °· You have pain in your neck or shoulder area. °This information is not intended to replace advice given to you by your health care provider. Make sure you discuss any questions you have with your health care provider. °Document Released: 01/09/2000 Document Revised: 09/10/2015 Document Reviewed: 08/14/2015 °Elsevier Interactive Patient Education © 2017 [employer] ° ° ° °Post Anesthesia Home Care Instructions ° °Activity: °Get plenty of rest for the remainder of the day. A responsible individual must stay with you for 24 hours following the procedure.  °For the next 24 hours, DO NOT: °-Drive a car °-Operate machinery °-Drink alcoholic beverages °-Take any medication unless instructed by your physician °-Make any legal decisions or sign important papers. ° °Meals: °Start with liquid foods such as gelatin or soup. Progress to regular foods as tolerated. Avoid greasy, spicy, heavy foods. If nausea and/or vomiting occur, drink only clear liquids until   the nausea and/or vomiting subsides. Call your physician if vomiting continues. ° °Special Instructions/Symptoms: °Your throat may feel dry or sore from the anesthesia or the breathing tube placed in your throat during surgery. If this causes discomfort, gargle with warm salt water. The discomfort should disappear within 24 hours. ° °If you had a scopolamine patch placed  behind your ear for the management of post- operative nausea and/or vomiting: ° °1. The medication in the patch is effective for 72 hours, after which it should be removed.  Wrap patch in a tissue and discard in the trash. Wash hands thoroughly with soap and water. °2. You may remove the patch earlier than 72 hours if you experience unpleasant side effects which may include dry mouth, dizziness or visual disturbances. °3. Avoid touching the patch. Wash your hands with soap and water after contact with the patch. °  ° ° °

## 2016-06-22 NOTE — H&P (Signed)
Melanie Luna is an 41 y.o. female. P3 here with new onset DUB. She had a Novasure ablation 15 years ago. Question septated uterus. 2.8 cm right posterior fibroid. 2.5 cm fundal lesion abutting the endometrium, most likely a fibroid. A focal endometrial lesion cannot be excluded. Endometrium is irregular. Endometrial thickness is difficult to evaluate due to the adjacent fibroid/endometrial lesion and small amount of fluid in the endometrial canal. A focal endometrial lesion cannot be excluded. Consider sonohysterogram forfurther evaluation, prior to hysteroscopy or endometrial biopsy.   No LMP recorded. Patient has had an ablation.    Past Medical History:  Diagnosis Date  . Diabetes mellitus   . Hypertension   . Insomnia     Past Surgical History:  Procedure Laterality Date  . CESAREAN SECTION    . ENDOMETRIAL ABLATION    . TUBAL LIGATION      Family History  Problem Relation Age of Onset  . Gout Other   . Diabetes Other     Social History:  reports that she has never smoked. She has never used smokeless tobacco. She reports that she does not drink alcohol or use drugs.  Allergies:  Allergies  Allergen Reactions  . Codeine Other (See Comments)    Reaction:  Seizures   . Penicillins Other (See Comments)    Reaction:  Seizures  Has patient had a PCN reaction causing immediate rash, facial/tongue/throat swelling, SOB or lightheadedness with hypotension: Yes Has patient had a PCN reaction causing severe rash involving mucus membranes or skin necrosis: No Has patient had a PCN reaction that required hospitalization: Yes Has patient had a PCN reaction occurring within the last 10 years: No If all of the above answers are "NO", then may proceed with Cephalosporin use.     Prescriptions Prior to Admission  Medication Sig Dispense Refill Last Dose  . atorvastatin (LIPITOR) 40 MG tablet Take 40 mg by mouth daily.   06/22/2016 at 0800  . insulin aspart (NOVOLOG FLEXPEN)  100 UNIT/ML FlexPen Inject 15 Units into the skin 3 (three) times daily with meals. (Patient taking differently: Inject 10 Units into the skin 3 (three) times daily as needed for high blood sugar. ) 15 mL 0 06/22/2016 at 0800  . Insulin Degludec (TRESIBA FLEXTOUCH) 200 UNIT/ML SOPN Inject 10 Units into the skin 2 (two) times daily. (Patient taking differently: Inject 30 Units into the skin daily. ) 3 mL 0 06/22/2016 at 0800  . lisinopril (PRINIVIL,ZESTRIL) 20 MG tablet Take 20 mg by mouth daily.   06/22/2016 at 0800  . megestrol (MEGACE) 40 MG tablet Take 1 tablet (40 mg total) by mouth 2 (two) times daily. (Patient taking differently: Take 40 mg by mouth every other day. ) 60 tablet 0 06/21/2016 at Unknown time  . ibuprofen (ADVIL,MOTRIN) 800 MG tablet Take 1 tablet (800 mg total) by mouth 3 (three) times daily. (Patient not taking: Reported on 02/11/2014) 21 tablet 0 Not Taking  . metroNIDAZOLE (FLAGYL) 500 MG tablet Take 1 tablet (500 mg total) by mouth 3 (three) times daily. (Patient not taking: Reported on 02/11/2014) 14 tablet 0 Not Taking  . metroNIDAZOLE (FLAGYL) 500 MG tablet Take 1 tablet (500 mg total) by mouth 2 (two) times daily. (Patient not taking: Reported on 06/15/2016) 14 tablet 0 Not Taking at Unknown time  . phenazopyridine (PYRIDIUM) 200 MG tablet Take 1 tablet (200 mg total) by mouth 3 (three) times daily as needed for pain. (Patient not taking: Reported on 05/26/2016) 20 tablet 0  Not Taking  . sulfamethoxazole-trimethoprim (BACTRIM DS,SEPTRA DS) 800-160 MG tablet Take 1 tablet by mouth 2 (two) times daily. (Patient not taking: Reported on 05/26/2016) 14 tablet 0 Not Taking    ROS  Works a Quarry manager and phlebotomy Had a BTL  Blood pressure (!) 142/94, pulse 99, temperature 98.2 F (36.8 C), temperature source Oral, resp. rate 18, SpO2 99 %. Physical Exam Heart- rrr Lungs- CTAB Abd- benign Results for orders placed or performed during the hospital encounter of 06/22/16 (from the past 24  hour(s))  Pregnancy, urine     Status: None   Collection Time: 06/22/16  1:10 PM  Result Value Ref Range   Preg Test, Ur NEGATIVE NEGATIVE    No results found.  Assessment/Plan: DUB, ? Septate uterus- plan for d&c, hysteroscopy  She understands the risks of surgery, including, but not to infection, bleeding, DVTs, damage to bowel, bladder, ureters. She wishes to proceed.     Emily Filbert 06/22/2016, 3:20 PM

## 2016-06-22 NOTE — Anesthesia Preprocedure Evaluation (Addendum)
Anesthesia Evaluation  Patient identified by MRN, date of birth, ID band Patient awake    Reviewed: Allergy & Precautions, NPO status , Patient's Chart, lab work & pertinent test results  Airway Mallampati: II  TM Distance: >3 FB Neck ROM: Full    Dental no notable dental hx.    Pulmonary neg pulmonary ROS,    Pulmonary exam normal breath sounds clear to auscultation       Cardiovascular hypertension, Normal cardiovascular exam Rhythm:Regular Rate:Normal     Neuro/Psych negative neurological ROS  negative psych ROS   GI/Hepatic negative GI ROS, Neg liver ROS,   Endo/Other  diabetes, Type 2  Renal/GU negative Renal ROS     Musculoskeletal negative musculoskeletal ROS (+)   Abdominal   Peds  Hematology negative hematology ROS (+)   Anesthesia Other Findings   Reproductive/Obstetrics                            Anesthesia Physical Anesthesia Plan  ASA: II  Anesthesia Plan: General   Post-op Pain Management:    Induction: Intravenous  Airway Management Planned: Oral ETT  Additional Equipment:   Intra-op Plan:   Post-operative Plan: Extubation in OR  Informed Consent: I have reviewed the patients History and Physical, chart, labs and discussed the procedure including the risks, benefits and alternatives for the proposed anesthesia with the patient or authorized representative who has indicated his/her understanding and acceptance.   Dental advisory given  Plan Discussed with: CRNA  Anesthesia Plan Comments:        Anesthesia Quick Evaluation

## 2016-06-23 ENCOUNTER — Encounter (HOSPITAL_COMMUNITY): Payer: Self-pay | Admitting: Obstetrics & Gynecology

## 2016-06-29 ENCOUNTER — Telehealth: Payer: Self-pay

## 2016-06-29 ENCOUNTER — Other Ambulatory Visit: Payer: Self-pay | Admitting: Obstetrics & Gynecology

## 2016-06-29 NOTE — Telephone Encounter (Signed)
Received call from patient who has question regarding her procedure that was done on a couple days ago. Patient stated she has noticing some dark blood after she wipes. I advised patient she will have some bleeding until her body heals from her procedure. Patient wanted to know when she go back to working out. I advised patient to wait a couple weeks before doing any heavy activity or lifting. Patient voice understanding at this time.

## 2016-07-05 ENCOUNTER — Encounter: Payer: Self-pay | Admitting: General Practice

## 2016-07-05 ENCOUNTER — Telehealth: Payer: Self-pay | Admitting: General Practice

## 2016-07-05 NOTE — Telephone Encounter (Signed)
Opened in error

## 2016-07-09 ENCOUNTER — Telehealth: Payer: Self-pay | Admitting: *Deleted

## 2016-07-09 NOTE — Telephone Encounter (Signed)
Called pt due to receiving Rx refill request from her pharmacy for Megace. I inquired why she would need the medication since she had surgery on 5/29. She stated that she did not request the medication and her pharmacy contacted her via the automatic system. She then asked about the brown discharge she is having and that it has an odor. She further stated that the discharge only has odor when it is on her pad for awhile. No odor is observed on tissue when she wipes after voiding.  I advised that the discharge is normal and will decrease over time. The odor is due to the fact that it is old blood. There is no problem or concern with this. Pt voiced understanding.

## 2016-07-27 ENCOUNTER — Inpatient Hospital Stay (HOSPITAL_COMMUNITY)
Admission: AD | Admit: 2016-07-27 | Discharge: 2016-07-27 | Disposition: A | Discharge status: Home / Self Care | Payer: Medicaid Other | Source: Ambulatory Visit | Attending: Family Medicine | Admitting: Family Medicine

## 2016-07-27 ENCOUNTER — Encounter (HOSPITAL_COMMUNITY): Payer: Self-pay | Admitting: *Deleted

## 2016-07-27 DIAGNOSIS — E119 Type 2 diabetes mellitus without complications: Secondary | ICD-10-CM | POA: Diagnosis not present

## 2016-07-27 DIAGNOSIS — Z880 Allergy status to penicillin: Secondary | ICD-10-CM | POA: Insufficient documentation

## 2016-07-27 DIAGNOSIS — Z794 Long term (current) use of insulin: Secondary | ICD-10-CM | POA: Diagnosis not present

## 2016-07-27 DIAGNOSIS — Z885 Allergy status to narcotic agent status: Secondary | ICD-10-CM | POA: Insufficient documentation

## 2016-07-27 DIAGNOSIS — N939 Abnormal uterine and vaginal bleeding, unspecified: Secondary | ICD-10-CM | POA: Diagnosis not present

## 2016-07-27 DIAGNOSIS — I10 Essential (primary) hypertension: Secondary | ICD-10-CM | POA: Diagnosis not present

## 2016-07-27 DIAGNOSIS — Z9889 Other specified postprocedural states: Secondary | ICD-10-CM

## 2016-07-27 HISTORY — DX: Benign neoplasm of connective and other soft tissue, unspecified: D21.9

## 2016-07-27 HISTORY — DX: Unspecified infectious disease: B99.9

## 2016-07-27 LAB — URINALYSIS, ROUTINE W REFLEX MICROSCOPIC
Bilirubin Urine: NEGATIVE
Glucose, UA: 150 mg/dL — AB
Ketones, ur: NEGATIVE mg/dL
NITRITE: POSITIVE — AB
PH: 5 (ref 5.0–8.0)
Protein, ur: 30 mg/dL — AB
SPECIFIC GRAVITY, URINE: 1.025 (ref 1.005–1.030)

## 2016-07-27 LAB — CBC
HEMATOCRIT: 34.1 % — AB (ref 36.0–46.0)
Hemoglobin: 11.4 g/dL — ABNORMAL LOW (ref 12.0–15.0)
MCH: 27.4 pg (ref 26.0–34.0)
MCHC: 33.4 g/dL (ref 30.0–36.0)
MCV: 82 fL (ref 78.0–100.0)
Platelets: 253 10*3/uL (ref 150–400)
RBC: 4.16 MIL/uL (ref 3.87–5.11)
RDW: 13.8 % (ref 11.5–15.5)
WBC: 9.4 10*3/uL (ref 4.0–10.5)

## 2016-07-27 LAB — POCT PREGNANCY, URINE: PREG TEST UR: NEGATIVE

## 2016-07-27 MED ORDER — KETOROLAC TROMETHAMINE 10 MG PO TABS: 10.0000 mg | ORAL_TABLET | Freq: Three times a day (TID) | ORAL | 0 refills | Status: DC | PRN

## 2016-07-27 MED ORDER — KETOROLAC TROMETHAMINE 60 MG/2ML IM SOLN
60.0000 mg | Freq: Once | INTRAMUSCULAR | Status: AC
  Administered 2016-07-27: 60 mg via INTRAMUSCULAR
  Filled 2016-07-27: qty 2

## 2016-07-27 MED ORDER — OXYCODONE-ACETAMINOPHEN 5-325 MG PO TABS: 1.0000 | ORAL_TABLET | ORAL | 0 refills | Status: DC | PRN

## 2016-07-27 NOTE — Discharge Instructions (Signed)

## 2016-07-27 NOTE — MAU Provider Note (Signed)
History     CSN: [id number]  Arrival date and time: 07/27/16 5456   None     Chief Complaint  Patient presents with  . Vaginal Bleeding  . Abdominal Pain   HPI   Ms.Melanie Luna is a 41 y.o. female [phone] here in MAU with abdominal pain and vaginal bleeding. She underwent DILATATION AND CURETTAGE /HYSTEROSCOPY on 5/29 due to acute onset of DUB. She had novasure ablasion 15 years ago. Following her procedure on 5/29 she bled for 2 weeks and then with the start of the 3rd week the bleeding had turned to brown blood. She woke up this morning with bright red vaginal bleeding and lower abdominal pain. She has not taken anything for pain today. She has changed her pad twice since waking up.  Denies recent intercourse or increase in activity.  She was taking megace prior to her surgery and then stopped it following. Her pain is located in her lower abdomen and it is cramp like.   OB History    Gravida Para Term Preterm AB Living   4       1 3    SAB TAB Ectopic Multiple Live Births     1     3      Past Medical History:  Diagnosis Date  . Diabetes mellitus   . Fibroid   . Hypertension   . Infection    UTI  . Insomnia     Past Surgical History:  Procedure Laterality Date  . CESAREAN SECTION    . ENDOMETRIAL ABLATION    . HYSTEROSCOPY W/D&C N/A 06/22/2016   Procedure: DILATATION AND CURETTAGE /HYSTEROSCOPY;  Surgeon: Emily Filbert, MD;  Location: [facility];  Service: Gynecology;  Laterality: N/A;  . TUBAL LIGATION      Family History  Problem Relation Age of Onset  . Gout Other   . Diabetes Father   . Heart disease Father   . Diabetes Maternal Grandmother   . Diabetes Maternal Grandfather   . Diabetes Paternal Grandmother   . Diabetes Paternal Grandfather     Social History  Substance Use Topics  . Smoking status: Never Smoker  . Smokeless tobacco: Never Used  . Alcohol use No    Allergies:  Allergies  Allergen Reactions  . Codeine Other (See Comments)   Reaction:  Seizures . Pt states she has tolerated Percocet in the past  . Penicillins Other (See Comments)    Reaction:  Seizures  Has patient had a PCN reaction causing immediate rash, facial/tongue/throat swelling, SOB or lightheadedness with hypotension: Yes Has patient had a PCN reaction causing severe rash involving mucus membranes or skin necrosis: No Has patient had a PCN reaction that required hospitalization: Yes Has patient had a PCN reaction occurring within the last 10 years: No If all of the above answers are "NO", then may proceed with Cephalosporin use.     Prescriptions Prior to Admission  Medication Sig Dispense Refill Last Dose  . atorvastatin (LIPITOR) 40 MG tablet Take 40 mg by mouth daily.   07/26/2016 at Unknown time  . ibuprofen (ADVIL,MOTRIN) 600 MG tablet Take 1 tablet (600 mg total) by mouth every 6 (six) hours as needed. (Patient taking differently: Take 600 mg by mouth every 6 (six) hours as needed for moderate pain or cramping. ) 30 tablet 1 Past Month at Unknown time  . insulin aspart (NOVOLOG FLEXPEN) 100 UNIT/ML FlexPen Inject 15 Units into the skin 3 (three) times daily with meals. (Patient  taking differently: Inject 10 Units into the skin 3 (three) times daily as needed for high blood sugar. ) 15 mL 0 07/27/2016 at Unknown time  . Insulin Degludec (TRESIBA FLEXTOUCH) 200 UNIT/ML SOPN Inject 10 Units into the skin 2 (two) times daily. (Patient taking differently: Inject 30 Units into the skin daily. ) 3 mL 0 07/27/2016 at Unknown time  . lisinopril (PRINIVIL,ZESTRIL) 20 MG tablet Take 20 mg by mouth daily.   07/26/2016 at Unknown time  . oxyCODONE-acetaminophen (PERCOCET/ROXICET) 5-325 MG tablet Take 1-2 tablets by mouth every 6 (six) hours as needed. (Patient taking differently: Take 1-2 tablets by mouth every 6 (six) hours as needed for moderate pain or severe pain. ) 5 tablet 0 Past Month at Unknown time  . megestrol (MEGACE) 40 MG tablet TAKE 1 TABLET (40 MG TOTAL) BY  MOUTH 2 (TWO) TIMES DAILY. (Patient not taking: Reported on 07/27/2016) 60 tablet 0 Not Taking at Unknown time   Results for orders placed or performed during the hospital encounter of 07/27/16 (from the past 48 hour(s))  Urinalysis, Routine w reflex microscopic     Status: Abnormal   Collection Time: 07/27/16  9:49 AM  Result Value Ref Range   Color, Urine YELLOW YELLOW   APPearance HAZY (A) CLEAR   Specific Gravity, Urine 1.025 1.005 - 1.030   pH 5.0 5.0 - 8.0   Glucose, UA 150 (A) NEGATIVE mg/dL   Hgb urine dipstick LARGE (A) NEGATIVE   Bilirubin Urine NEGATIVE NEGATIVE   Ketones, ur NEGATIVE NEGATIVE mg/dL   Protein, ur 30 (A) NEGATIVE mg/dL   Nitrite POSITIVE (A) NEGATIVE   Leukocytes, UA TRACE (A) NEGATIVE   RBC / HPF TOO NUMEROUS TO COUNT 0 - 5 RBC/hpf   WBC, UA 6-30 0 - 5 WBC/hpf   Bacteria, UA RARE (A) NONE SEEN   Squamous Epithelial / LPF 0-5 (A) NONE SEEN  Pregnancy, urine POC     Status: None   Collection Time: 07/27/16  9:51 AM  Result Value Ref Range   Preg Test, Ur NEGATIVE NEGATIVE    Comment:        THE SENSITIVITY OF THIS METHODOLOGY IS >24 mIU/mL   CBC     Status: Abnormal   Collection Time: 07/27/16 11:19 AM  Result Value Ref Range   WBC 9.4 4.0 - 10.5 K/uL   RBC 4.16 3.87 - 5.11 MIL/uL   Hemoglobin 11.4 (L) 12.0 - 15.0 g/dL   HCT 34.1 (L) 36.0 - 46.0 %   MCV 82.0 78.0 - 100.0 fL   MCH 27.4 26.0 - 34.0 pg   MCHC 33.4 30.0 - 36.0 g/dL   RDW 13.8 11.5 - 15.5 %   Platelets 253 150 - 400 K/uL    Review of Systems  Constitutional: Negative for fever.  Gastrointestinal: Positive for abdominal pain and nausea. Negative for constipation, diarrhea and vomiting.  Genitourinary: Negative for dysuria.   Physical Exam   Blood pressure (!) 157/98, pulse 88, temperature 98.2 F (36.8 C), temperature source Oral, resp. rate 18, height 5' (1.524 m), weight 165 lb 4 oz (75 kg), SpO2 100 %.  Physical Exam  Constitutional: She is oriented to person, place, and  time. She appears well-developed and well-nourished. No distress.  HENT:  Head: Normocephalic.  Eyes: Pupils are equal, round, and reactive to light.  Respiratory: Effort normal.  GI: Soft. There is tenderness in the right lower quadrant, suprapubic area and left lower quadrant. There is no rigidity, no rebound and  no guarding.  Genitourinary:  Genitourinary Comments: Vagina - Small amount of dark red blood, no odor  Cervix - scant amount of active bleeding  Bimanual exam: Cervix closed, no CMT  Uterus non tender, normal size Adnexa non tender, no masses bilaterally Chaperone present for exam.   Musculoskeletal: Normal range of motion.  Neurological: She is alert and oriented to person, place, and time.  Skin: Skin is warm. She is not diaphoretic.  Psychiatric: Her behavior is normal.    MAU Course  Procedures  None  MDM  Urine culture ordered, +nitrites, patient asymptomatic  Toradol given 60 mg IM. Patient down from 9/10 to 0/10 following Toradol  CBC stable   Assessment and Plan   A:  1. Abnormal uterine bleeding   2. Status post hysteroscopy     P:  Discharge home in stable condition Patient is scheduled to see Dr. Hulan Fray this month.  If bleeding continues or becomes heavy patient advised to resume Megace. Patient has RX.  Return to MAU if symptoms worse Bleeding precautions   Felis Quillin, Artist Pais, NP 07/27/2016 3:04 PM

## 2016-07-27 NOTE — MAU Note (Signed)
Had D&C on 5/29, and removed a fibroid tumor.  Bled for a wk after the procedure. Today woke up with bleeding and pain.  Had an ablation 15 yrs ago, had not been having periods, asked MD prior to procedure if would need another ablation, was told no. pt was under the impression she would not have periods.  Feels weak

## 2016-07-30 ENCOUNTER — Other Ambulatory Visit: Payer: Self-pay | Admitting: Obstetrics and Gynecology

## 2016-07-30 LAB — CULTURE, OB URINE: Special Requests: NORMAL

## 2016-07-30 MED ORDER — SULFAMETHOXAZOLE-TRIMETHOPRIM 800-160 MG PO TABS: 1.0000 | ORAL_TABLET | Freq: Two times a day (BID) | ORAL | 0 refills | Status: DC

## 2016-07-30 NOTE — Progress Notes (Signed)
+   urine culture Bactrim sent to the pharmacy.  Patient aware.    Rasch, Artist Pais, NP

## 2016-07-31 ENCOUNTER — Other Ambulatory Visit: Payer: Self-pay | Admitting: Advanced Practice Midwife

## 2016-07-31 NOTE — Progress Notes (Signed)
Called patient, voicemail full and cannot accept messages +UC for E. Coli Sent RX for bactrim to [facility] on [address] [place] 9:18 AM 07/31/16

## 2016-08-06 NOTE — Addendum Note (Signed)
Addendum  created 08/06/16 1123 by Nolon Nations, MD   Sign clinical note

## 2016-08-10 ENCOUNTER — Ambulatory Visit: Payer: Medicaid Other | Admitting: Obstetrics & Gynecology

## 2016-08-10 ENCOUNTER — Ambulatory Visit (INDEPENDENT_AMBULATORY_CARE_PROVIDER_SITE_OTHER): Payer: Medicaid Other | Admitting: Obstetrics and Gynecology

## 2016-08-10 ENCOUNTER — Encounter: Payer: Self-pay | Admitting: Obstetrics and Gynecology

## 2016-08-10 VITALS — BP 134/99 | HR 99 | Ht 60.0 in | Wt 162.9 lb

## 2016-08-10 DIAGNOSIS — N939 Abnormal uterine and vaginal bleeding, unspecified: Secondary | ICD-10-CM | POA: Diagnosis not present

## 2016-08-10 DIAGNOSIS — Z9889 Other specified postprocedural states: Secondary | ICD-10-CM | POA: Diagnosis not present

## 2016-08-10 DIAGNOSIS — Z09 Encounter for follow-up examination after completed treatment for conditions other than malignant neoplasm: Secondary | ICD-10-CM

## 2016-08-10 MED ORDER — MEGESTROL ACETATE 40 MG PO TABS: 40.0000 mg | ORAL_TABLET | Freq: Every day | ORAL | 0 refills | Status: DC

## 2016-08-10 NOTE — Progress Notes (Signed)
Post Op Visit 08/10/2016  CC: Regular post op visit  Subjective:    S/p 5/29 hysteroscopy D&C for AUB, by Dr. Hulan Fray. Patient discharged home from PACU.  Patient misscheduled for AM appt this AM and rescheduled with Dr. Hulan Fray for a later date but thought it was for today and can't take another day off of work so wanted to be seen this afternoon.   Patient states she had amenorrhea x 15 years after her endometrial ablation until right after her 41th bday when she started having AUB.   Patient states was amenorrheic to slight bleeding until 7/3 MAU visit when she had heavy bleeding and what felt like a period. Patient put on megace and was taking it qday and not bid and was amenorrheic. She stopped it yesterday but she doesn't like taking it and is now having some spotting.  She has had nothing per vagina since surgery.  Review of Systems Pertinent items are noted in HPI.    Objective:    BP (!) 134/99   Pulse 99   Ht 5' (1.524 m)   Wt 162 lb 14.4 oz (73.9 kg)   LMP 07/27/2016 (Exact Date)   BMI 31.81 kg/m    NAD Abdomen: soft, nttp, nd GU: EGBUS normal, vagina normal with slight pink d/c in vault, cervix normall appearing, nttp. No  Assessment:    Doing well postoperatively. Operative findings again reviewed. Pathology report discussed.    Plan:   D/w her that I recommend continuing with the qday megace to keep the AUB stopped while a final plan is put in place by Dr. Hulan Fray since she doesn't desire to be on the megace long term. Briefly d/w her that there are some surgical and medical options available.   Note will be forwarded to Dr. Hulan Fray and asked her to call patient to go over options.  Patient amenable to plan.  Durene Romans MD Attending Center for [employer])

## 2016-08-12 ENCOUNTER — Ambulatory Visit: Payer: Medicaid Other | Admitting: Obstetrics and Gynecology

## 2016-08-19 ENCOUNTER — Ambulatory Visit: Payer: Medicaid Other | Admitting: Obstetrics & Gynecology

## 2016-08-19 ENCOUNTER — Ambulatory Visit: Payer: Medicaid Other | Admitting: Obstetrics and Gynecology

## 2016-09-07 ENCOUNTER — Inpatient Hospital Stay (HOSPITAL_COMMUNITY)
Admission: AD | Admit: 2016-09-07 | Discharge: 2016-09-07 | Disposition: A | Discharge status: Home / Self Care | Payer: Medicaid Other | Source: Ambulatory Visit | Attending: Family Medicine | Admitting: Family Medicine

## 2016-09-07 ENCOUNTER — Telehealth: Payer: Self-pay | Admitting: General Practice

## 2016-09-07 ENCOUNTER — Encounter (HOSPITAL_COMMUNITY): Payer: Self-pay

## 2016-09-07 ENCOUNTER — Inpatient Hospital Stay (HOSPITAL_COMMUNITY): Payer: Medicaid Other

## 2016-09-07 DIAGNOSIS — N939 Abnormal uterine and vaginal bleeding, unspecified: Secondary | ICD-10-CM | POA: Diagnosis present

## 2016-09-07 DIAGNOSIS — E119 Type 2 diabetes mellitus without complications: Secondary | ICD-10-CM | POA: Insufficient documentation

## 2016-09-07 DIAGNOSIS — Z885 Allergy status to narcotic agent status: Secondary | ICD-10-CM | POA: Insufficient documentation

## 2016-09-07 DIAGNOSIS — Z833 Family history of diabetes mellitus: Secondary | ICD-10-CM | POA: Diagnosis not present

## 2016-09-07 DIAGNOSIS — Z79899 Other long term (current) drug therapy: Secondary | ICD-10-CM | POA: Insufficient documentation

## 2016-09-07 DIAGNOSIS — I10 Essential (primary) hypertension: Secondary | ICD-10-CM | POA: Diagnosis not present

## 2016-09-07 DIAGNOSIS — Z794 Long term (current) use of insulin: Secondary | ICD-10-CM | POA: Insufficient documentation

## 2016-09-07 DIAGNOSIS — R102 Pelvic and perineal pain: Secondary | ICD-10-CM | POA: Diagnosis not present

## 2016-09-07 DIAGNOSIS — D72829 Elevated white blood cell count, unspecified: Secondary | ICD-10-CM

## 2016-09-07 DIAGNOSIS — Z9889 Other specified postprocedural states: Secondary | ICD-10-CM | POA: Insufficient documentation

## 2016-09-07 DIAGNOSIS — Z880 Allergy status to penicillin: Secondary | ICD-10-CM | POA: Insufficient documentation

## 2016-09-07 DIAGNOSIS — Z8249 Family history of ischemic heart disease and other diseases of the circulatory system: Secondary | ICD-10-CM | POA: Diagnosis not present

## 2016-09-07 LAB — CBC
HCT: 36.7 % (ref 36.0–46.0)
HEMOGLOBIN: 12.2 g/dL (ref 12.0–15.0)
MCH: 27.4 pg (ref 26.0–34.0)
MCHC: 33.2 g/dL (ref 30.0–36.0)
MCV: 82.5 fL (ref 78.0–100.0)
PLATELETS: 273 10*3/uL (ref 150–400)
RBC: 4.45 MIL/uL (ref 3.87–5.11)
RDW: 12.8 % (ref 11.5–15.5)
WBC: 19.6 10*3/uL — AB (ref 4.0–10.5)

## 2016-09-07 LAB — URINALYSIS, ROUTINE W REFLEX MICROSCOPIC
Bilirubin Urine: NEGATIVE
GLUCOSE, UA: NEGATIVE mg/dL
KETONES UR: NEGATIVE mg/dL
NITRITE: NEGATIVE
PH: 5 (ref 5.0–8.0)
Protein, ur: 30 mg/dL — AB
Specific Gravity, Urine: 1.02 (ref 1.005–1.030)

## 2016-09-07 LAB — DIFFERENTIAL
BASOS ABS: 0 10*3/uL (ref 0.0–0.1)
Basophils Relative: 0 %
Eosinophils Absolute: 0 10*3/uL (ref 0.0–0.7)
Eosinophils Relative: 0 %
LYMPHS ABS: 2.6 10*3/uL (ref 0.7–4.0)
Lymphocytes Relative: 14 %
MONO ABS: 0.8 10*3/uL (ref 0.1–1.0)
MONOS PCT: 4 %
NEUTROS ABS: 15.8 10*3/uL — AB (ref 1.7–7.7)
Neutrophils Relative %: 82 %

## 2016-09-07 LAB — WET PREP, GENITAL
CLUE CELLS WET PREP: NONE SEEN
Sperm: NONE SEEN
Trich, Wet Prep: NONE SEEN
Yeast Wet Prep HPF POC: NONE SEEN

## 2016-09-07 LAB — POCT PREGNANCY, URINE: Preg Test, Ur: NEGATIVE

## 2016-09-07 MED ORDER — AZITHROMYCIN 250 MG PO TABS
1000.0000 mg | ORAL_TABLET | Freq: Once | ORAL | Status: AC
  Administered 2016-09-07: 1000 mg via ORAL
  Filled 2016-09-07: qty 4

## 2016-09-07 MED ORDER — KETOROLAC TROMETHAMINE 60 MG/2ML IM SOLN
60.0000 mg | Freq: Once | INTRAMUSCULAR | Status: AC
  Administered 2016-09-07: 60 mg via INTRAMUSCULAR
  Filled 2016-09-07: qty 2

## 2016-09-07 MED ORDER — CEFTRIAXONE SODIUM 250 MG IJ SOLR
250.0000 mg | Freq: Once | INTRAMUSCULAR | Status: AC
  Administered 2016-09-07: 250 mg via INTRAMUSCULAR
  Filled 2016-09-07: qty 250

## 2016-09-07 NOTE — Progress Notes (Signed)
Pt denies dizziness or visual changes during BP checks.  Tolerated well.

## 2016-09-07 NOTE — MAU Note (Signed)
bleeding began Sat am as spotting & intermittent through Sun. Has progressed to bright red bleeding with golf ball sized clots. States she is now saturating a pad in an hour.  Numbness in bliat groin, thighs & lower back began this am & pt is unable to walk effectively.  Pt states aching pain down bilat thigh.  Unsure if she is having abdm pain.  States sexual intercourse Fri night. Denies visual changes.

## 2016-09-07 NOTE — Progress Notes (Signed)
Discharge instructions reviewed & discussed. Pt encouraged to stay x22mins after meds, but declines secondary to having to go to work.  Pt verb understanding & has no ques/concerns.

## 2016-09-07 NOTE — MAU Provider Note (Cosign Needed)
History     CSN: [id number] Arrival date and time: 09/07/16 1138  First Provider Initiated Contact with Patient 09/07/16 1323      Chief Complaint  Patient presents with  . Vaginal Bleeding    HPI: Melanie Luna is a 41 y.o. who presents to maternity admissions reporting vaginal bleeding x 3 days. She is s/p histerescopy and D&C on May 2018 fo AUB. Prior to that she had amenorrhea for about 15 years after an endometrial ablation. After procedure in May, patient was seen here on 7/3 for vaginal bleeding and placed on megace, which she reports she is still taking and had not had more bleeding, until  3 days ago. She reports vaginal bleeding started after intercourse this past Friday, which was the first time since she had surgery. Reports she does not use condoms as they always cause her to have a reaction or UTI. She also reports that she is having a lot of cramping and pelvic pain. Pain radiates to her groin and back.. Reports she has history of recurrent UTI, but denies any urinary symptoms at this time, including no urinary frequency, dysuria or hematuria. Denies any fever, chills, nausea, vomiting, or diarrhea. Reports she has chills a time, but this is chronic and she attributes this to her diabetes.   Of note, patient also notes that she has been told multiple times that she has elevated WBCs, but unsure if she has been worked up for this in the past.   Past obstetric history: OB History  Gravida Para Term Preterm AB Living  4       1 3   SAB TAB Ectopic Multiple Live Births    1     3    # Outcome Date GA Lbr Len/2nd Weight Sex Delivery Anes PTL Lv  4 Gravida      CS-LTranv     3 Gravida      Vag-Spont     2 Gravida      Vag-Spont     1 TAB               Past Medical History:  Diagnosis Date  . Diabetes mellitus    takes insulin  . Fibroid   . Hypertension    takes meds  . Infection    UTI  . Insomnia     Past Surgical History:  Procedure Laterality Date  . CESAREAN  SECTION    . ENDOMETRIAL ABLATION    . HYSTEROSCOPY W/D&C N/A 06/22/2016   Procedure: DILATATION AND CURETTAGE /HYSTEROSCOPY;  Surgeon: Emily Filbert, MD;  Location: [facility];  Service: Gynecology;  Laterality: N/A;  . TUBAL LIGATION      Family History  Problem Relation Age of Onset  . Gout Other   . Diabetes Father   . Heart disease Father   . Diabetes Maternal Grandmother   . Diabetes Maternal Grandfather   . Diabetes Paternal Grandmother   . Diabetes Paternal Grandfather     Social History  Substance Use Topics  . Smoking status: Never Smoker  . Smokeless tobacco: Never Used  . Alcohol use No    Allergies:  Allergies  Allergen Reactions  . Codeine Other (See Comments)    Reaction:  Seizures . Pt states she has tolerated Percocet in the past  . Penicillins Other (See Comments)    Reaction:  Seizures  Has patient had a PCN reaction causing immediate rash, facial/tongue/throat swelling, SOB or lightheadedness with hypotension: Yes Has patient had  a PCN reaction causing severe rash involving mucus membranes or skin necrosis: No Has patient had a PCN reaction that required hospitalization: Yes Has patient had a PCN reaction occurring within the last 10 years: No If all of the above answers are "NO", then may proceed with Cephalosporin use.     Prescriptions Prior to Admission  Medication Sig Dispense Refill Last Dose  . atorvastatin (LIPITOR) 40 MG tablet Take 40 mg by mouth daily.   09/06/2016 at Unknown time  . insulin aspart (NOVOLOG FLEXPEN) 100 UNIT/ML FlexPen Inject 15 Units into the skin 3 (three) times daily with meals. (Patient taking differently: Inject 10 Units into the skin 3 (three) times daily as needed for high blood sugar. ) 15 mL 0 09/06/2016 at 2300  . Insulin Degludec (TRESIBA FLEXTOUCH) 200 UNIT/ML SOPN Inject 10 Units into the skin 2 (two) times daily. (Patient taking differently: Inject 30 Units into the skin daily. ) 3 mL 0 09/06/2016 at 2300  . lisinopril  (PRINIVIL,ZESTRIL) 20 MG tablet Take 20 mg by mouth daily.   09/07/2016 at 0730  . megestrol (MEGACE) 40 MG tablet Take 1 tablet (40 mg total) by mouth daily. Up it to twice a day if you bleeding. 60 tablet 0 09/07/2016 at 0730    Review of Systems - Negative except for what is mentioned in HPI.  Physical Exam   Blood pressure 123/83, pulse (!) 118, temperature 98.5 F (36.9 C), temperature source Oral, resp. rate 18, SpO2 100 %.  Constitutional: Well-developed, well-nourished female in no acute distress.  Cardiovascular: normal rate, regular rythm Respiratory: normal effort, lungs CTAB.  GI: Abd soft, mild diffuse tenderness w/o rebound or guarding.   GU: Neg CVAT bilatearlly Pelvic: small amount of blood in vaginal vault without active bleeding; no vaginal or cervical lesions or erosions. +CMT and L adnexal tenderness. No palpable masses or fullness, but exam limited by body habitus. MSK: Extremities nontender, no edema, normal ROM Neurologic: Alert and oriented x 4. Psych: Normal mood and affect    MAU Course  Procedures  MDM Patient seen and exam. Pelvic exam with CMT and left adnexal tenderness.  CBC reviewed showing normal Hgb, but leukocytosis of 19.6k with 82% neutrophils.  Wet prep collected, showing many WBC. Given CMT/left adnexal tenderness, many WBCs on wet prep, and leukocytosis, pelvic U/S ordered to r/o TOA.  U/S:  FINDINGS: Uterus Measurements: 10.1 x 6.1 x 6.4 cm. Heterogeneous echotexture. No focal measurable fibroid or mass. Endometrium Thickness: 7 mm in thickness.  No focal abnormality visualized. Right ovary Measurements: 2.4 x 1.8 x 1.7 cm. Normal appearance/no adnexal mass. Left ovary Measurements: 4.0 x 1.7 x 2.9 cm. Normal appearance/no adnexal mass. Other findings:  No abnormal free fluid  IMPRESSION: No acute or significant abnormality.  Assessment and Plan  40 y.o. female p/w AUB and pelvic pain. Exam concerning for PID. U/S negative for  TOA. She does not have fever or other systemic signs of systemic infection. She does have leukocytosis, which patient reports being a chronic issue, but more elevated than her baseline, and WBC was down to 9.4 last month, and now back up to 19.6. She is hemodynamically stable and hgb is 12.2.  Pelvic pain --Emperically treated with ceftriaxone 250 mg IM and azithromycin 1 gram po. (Discussed doxycycline x 14 days for full empiric treatment for PID, but patient declined and reported she would not take long course of abx).  --GC/Chlamydia probe sent --Full STD screening including HIV offered; patient declined  Vaginal bleeding --Advised to follow up with Dr. Hulan Fray if persistent/recurrent bleeding  Leukocytosis --Given history of recurrent leukocytosis, advised follow up with PCP in about 2 weeks. If persistent leukocytosis, will need further work up  --Precautions given on systemic symptoms of infection and when to seen urgent care.   Dispo: --Discharge home in stable condition.     Henlee Donovan, Jenne Pane, MD 09/07/2016 3:25 PM

## 2016-09-07 NOTE — Telephone Encounter (Signed)
Patient called office asking if Dr. Hulan Fray or Dr. Ilda Basset were in the office.  I told her no and asked how can I help her.  She stated that she had surgery and she is in pain.  Also stated that Dr. Hulan Fray and Dr. Ilda Basset said that she has recovered 100% and it feels like she is having a baby and bleeding with large blood clots.  Advised patient to come to the hospital to Maternity Admissions to be evaluated.  Patient said that she was traveling on the highway and that her legs felt numb.  Advised patient if she starts to feel faint and can't feel her legs, that she need to pull over and call 911. Patient said she didn't want to call 911 and she is on her way to the hospital.  Spoke with nurse Diane here in office and she advised patient to do the same thing.  Patient asked if I could call MAU to notify them that she was on her way.  Called Debra in MAU and notified her in regards to patient request.

## 2016-09-08 LAB — GC/CHLAMYDIA PROBE AMP (~~LOC~~) NOT AT ARMC
Chlamydia: NEGATIVE
NEISSERIA GONORRHEA: NEGATIVE

## 2016-09-24 ENCOUNTER — Encounter (HOSPITAL_COMMUNITY): Payer: Self-pay

## 2016-09-24 ENCOUNTER — Encounter: Payer: Self-pay | Admitting: Obstetrics & Gynecology

## 2016-09-24 ENCOUNTER — Ambulatory Visit (INDEPENDENT_AMBULATORY_CARE_PROVIDER_SITE_OTHER): Payer: Medicaid Other | Admitting: Obstetrics & Gynecology

## 2016-09-24 VITALS — BP 146/104 | Wt 162.5 lb

## 2016-09-24 DIAGNOSIS — N938 Other specified abnormal uterine and vaginal bleeding: Secondary | ICD-10-CM | POA: Diagnosis present

## 2016-09-24 MED ORDER — MEGESTROL ACETATE 40 MG PO TABS: 80.0000 mg | ORAL_TABLET | Freq: Two times a day (BID) | ORAL | 3 refills | Status: DC

## 2016-09-24 NOTE — Progress Notes (Signed)
   Subjective:    Patient ID: Alenah S Crall, female    DOB: 1975-05-27, 40 y.o.   MRN: [id number]  HPI 41 yo AA P3 here because she has continued to bleed heavily since her d&c 5/18 (normal pathology). She had an endometrial ablation about 15 years ago and had no bleeding until about 9 months ago. She is currently taking megace 40 mg QID. She would like another ablation, declines a Mirena.   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    Objective:   Physical Exam Well nourished, well hydrated black female, no apparent distress Breathing, conversing, and ambulating normally Abd- benign      Assessment & Plan:  Change megace to 80 mg BID Schedule her for a HTA

## 2016-10-29 ENCOUNTER — Inpatient Hospital Stay (HOSPITAL_COMMUNITY)
Admission: AD | Admit: 2016-10-29 | Discharge: 2016-10-29 | Disposition: A | Discharge status: Home / Self Care | Payer: Medicaid Other | Source: Ambulatory Visit | Attending: Obstetrics and Gynecology | Admitting: Obstetrics and Gynecology

## 2016-10-29 ENCOUNTER — Inpatient Hospital Stay (HOSPITAL_COMMUNITY): Payer: Medicaid Other

## 2016-10-29 ENCOUNTER — Encounter (HOSPITAL_COMMUNITY): Payer: Self-pay | Admitting: *Deleted

## 2016-10-29 DIAGNOSIS — Z880 Allergy status to penicillin: Secondary | ICD-10-CM | POA: Insufficient documentation

## 2016-10-29 DIAGNOSIS — N939 Abnormal uterine and vaginal bleeding, unspecified: Secondary | ICD-10-CM | POA: Insufficient documentation

## 2016-10-29 DIAGNOSIS — R102 Pelvic and perineal pain: Secondary | ICD-10-CM

## 2016-10-29 DIAGNOSIS — Z885 Allergy status to narcotic agent status: Secondary | ICD-10-CM | POA: Diagnosis not present

## 2016-10-29 DIAGNOSIS — O4690 Antepartum hemorrhage, unspecified, unspecified trimester: Secondary | ICD-10-CM

## 2016-10-29 DIAGNOSIS — I10 Essential (primary) hypertension: Secondary | ICD-10-CM | POA: Diagnosis not present

## 2016-10-29 LAB — HCG, QUANTITATIVE, PREGNANCY: hCG, Beta Chain, Quant, S: <1 m[IU]/mL (ref <5)

## 2016-10-29 LAB — URINALYSIS, ROUTINE W REFLEX MICROSCOPIC
Bilirubin Urine: NEGATIVE
Ketones, ur: NEGATIVE mg/dL
Nitrite: NEGATIVE
PH: 5 (ref 5.0–8.0)
Protein, ur: NEGATIVE mg/dL
SPECIFIC GRAVITY, URINE: 1.035 — AB (ref 1.005–1.030)

## 2016-10-29 LAB — CBC
HEMATOCRIT: 36 % (ref 36.0–46.0)
HEMOGLOBIN: 12 g/dL (ref 12.0–15.0)
MCH: 27.1 pg (ref 26.0–34.0)
MCHC: 33.3 g/dL (ref 30.0–36.0)
MCV: 81.3 fL (ref 78.0–100.0)
PLATELETS: 290 10*3/uL (ref 150–400)
RBC: 4.43 MIL/uL (ref 3.87–5.11)
RDW: 13.4 % (ref 11.5–15.5)
WBC: 13.8 10*3/uL — AB (ref 4.0–10.5)

## 2016-10-29 LAB — WET PREP, GENITAL
SPERM: NONE SEEN
TRICH WET PREP: NONE SEEN
YEAST WET PREP: NONE SEEN

## 2016-10-29 LAB — POCT PREGNANCY, URINE: Preg Test, Ur: POSITIVE — AB

## 2016-10-29 LAB — ABO/RH: ABO/RH(D): B POS

## 2016-10-29 NOTE — MAU Note (Signed)
End of shift report given to Lynnette Caffey, Therapist, sports.

## 2016-10-29 NOTE — Discharge Instructions (Signed)

## 2016-10-29 NOTE — MAU Note (Signed)
Took a preg test like 7-10days ago. Was positive.  2 days ago started spotting, then got heavier then stopped. Is bleeding now, was really bad this morning- bleeding and clotting. Soaked a pad an hour this morning, stopped around 10, started again at noon.  cramping

## 2016-10-29 NOTE — Patient Instructions (Addendum)
Your procedure is scheduled on: Thursday November 11, 2016 at 11:55  Enter through the Main Entrance of [facility] at: 10:30 am  Pick up the phone at the desk and dial [phone].  Call this number if you have problems the morning of surgery: [phone].  Remember: Do NOT eat food or drink clear liquids (including water) after midnight on Wednesday October 17th   Take these medicines the morning of surgery with a SIP OF WATER: None.  (Do not take your blood blood pressure (lisinopril med on day of surgery.)  Do not take any diabetes medication on day of surgery.  We will check your bloods sugar upon arrival and treat if needed.  Do not take your Wednesday night dose of tresiba 40 units.  Do NOT wear jewelry (body piercing), metal hair clips/bobby pins, make-up, or nail polish. Do NOT wear lotions, powders, or perfumes.  You may wear deoderant. Do NOT shave for 48 hours prior to surgery. Do NOT bring valuables to the hospital. Contacts may not be worn into surgery.  Have a responsible adult drive you home and stay with you for 24 hours after your procedure.  Home with Melanie Luna cell [phone].

## 2016-10-29 NOTE — MAU Provider Note (Signed)
History     CSN: [id number]  Arrival date and time: 10/29/16 1703   First Provider Initiated Contact with Patient 10/29/16 1831      Chief Complaint  Patient presents with  . Possible Pregnancy  . Vaginal Bleeding  . Abdominal Pain   [id number] @unknown  gestation here with VB. Pt had +HPT about 2 weeks ago. She did not think she could conceive and thought she had a BTL 15 years ago. Bleeding started 2 days ago and was originally pink then became bright red and heavier yesterday. Associated sx are intermittent diarrhea and bilateral abdominal pain. She did not take anything for the pain. She is unsure of her LMP since hx of AUB and planning another endometrial ablation in 2 weeks.   Past Medical History:  Diagnosis Date  . Diabetes mellitus    takes insulin  . Fibroid   . Hypertension    takes meds  . Infection    UTI  . Insomnia     Past Surgical History:  Procedure Laterality Date  . CESAREAN SECTION    . ENDOMETRIAL ABLATION    . HYSTEROSCOPY W/D&C N/A 06/22/2016   Procedure: DILATATION AND CURETTAGE /HYSTEROSCOPY;  Surgeon: Emily Filbert, MD;  Location: [facility];  Service: Gynecology;  Laterality: N/A;  . TUBAL LIGATION      Family History  Problem Relation Age of Onset  . Gout Other   . Diabetes Father   . Heart disease Father   . Diabetes Maternal Grandmother   . Diabetes Maternal Grandfather   . Diabetes Paternal Grandmother   . Diabetes Paternal Grandfather     Social History  Substance Use Topics  . Smoking status: Never Smoker  . Smokeless tobacco: Never Used  . Alcohol use No    Allergies:  Allergies  Allergen Reactions  . Codeine Other (See Comments)    Reaction:  Seizures . Pt states she has tolerated Percocet in the past  . Penicillins Other (See Comments)    Reaction:  Seizures  Has patient had a PCN reaction causing immediate rash, facial/tongue/throat swelling, SOB or lightheadedness with hypotension: Yes Has patient had a PCN reaction  causing severe rash involving mucus membranes or skin necrosis: No Has patient had a PCN reaction that required hospitalization: Yes Has patient had a PCN reaction occurring within the last 10 years: No If all of the above answers are "NO", then may proceed with Cephalosporin use.     Prescriptions Prior to Admission  Medication Sig Dispense Refill Last Dose  . insulin aspart (NOVOLOG FLEXPEN) 100 UNIT/ML FlexPen Inject 15 Units into the skin 3 (three) times daily with meals. (Patient taking differently: Inject 15 Units into the skin 3 (three) times daily as needed for high blood sugar. ) 15 mL 0 Taking  . Insulin Degludec (TRESIBA FLEXTOUCH) 200 UNIT/ML SOPN Inject 10 Units into the skin 2 (two) times daily. (Patient taking differently: Inject 40 Units into the skin daily. ) 3 mL 0 Taking  . lisinopril (PRINIVIL,ZESTRIL) 30 MG tablet Take 30 mg by mouth daily.    Taking  . megestrol (MEGACE) 40 MG tablet Take 2 tablets (80 mg total) by mouth 2 (two) times daily. 60 tablet 3     Review of Systems  Constitutional: Negative for fever.  Gastrointestinal: Positive for abdominal pain.  Genitourinary: Positive for vaginal bleeding.   Physical Exam   Blood pressure (!) 156/97, pulse 96, temperature 98.2 F (36.8 C), temperature source Oral, resp. rate 18, weight 163  lb 4 oz (74 kg).  Physical Exam  Constitutional: She is oriented to person, place, and time. She appears well-developed and well-nourished. No distress.  HENT:  Head: Normocephalic and atraumatic.  Neck: Normal range of motion.  Respiratory: Effort normal.  GI: Soft. She exhibits no distension. There is no tenderness.  Genitourinary:  Genitourinary Comments: External: no lesions or erythema Vagina: rugated, pink, moist, scant drk red discharge Uterus: non enlarged, anteverted, non tender, no CMT Adnexae: no masses, no tenderness left, no tenderness right   Musculoskeletal: Normal range of motion.  Neurological: She is  alert and oriented to person, place, and time.  Skin: Skin is warm and dry.  Psychiatric: She has a normal mood and affect.    Results for orders placed or performed during the hospital encounter of 10/29/16 (from the past 24 hour(s))  Urinalysis, Routine w reflex microscopic     Status: Abnormal   Collection Time: 10/29/16  5:10 PM  Result Value Ref Range   Color, Urine YELLOW YELLOW   APPearance CLEAR CLEAR   Specific Gravity, Urine 1.035 (H) 1.005 - 1.030   pH 5.0 5.0 - 8.0   Glucose, UA >=500 (A) NEGATIVE mg/dL   Hgb urine dipstick LARGE (A) NEGATIVE   Bilirubin Urine NEGATIVE NEGATIVE   Ketones, ur NEGATIVE NEGATIVE mg/dL   Protein, ur NEGATIVE NEGATIVE mg/dL   Nitrite NEGATIVE NEGATIVE   Leukocytes, UA MODERATE (A) NEGATIVE   RBC / HPF 0-5 0 - 5 RBC/hpf   WBC, UA 0-5 0 - 5 WBC/hpf   Bacteria, UA RARE (A) NONE SEEN   Squamous Epithelial / LPF 0-5 (A) NONE SEEN  Pregnancy, urine POC     Status: Abnormal   Collection Time: 10/29/16  6:12 PM  Result Value Ref Range   Preg Test, Ur POSITIVE (A) NEGATIVE  Wet prep, genital     Status: Abnormal   Collection Time: 10/29/16  6:55 PM  Result Value Ref Range   Yeast Wet Prep HPF POC NONE SEEN NONE SEEN   Trich, Wet Prep NONE SEEN NONE SEEN   Clue Cells Wet Prep HPF POC PRESENT (A) NONE SEEN   WBC, Wet Prep HPF POC MODERATE (A) NONE SEEN   Sperm NONE SEEN   CBC     Status: Abnormal   Collection Time: 10/29/16  7:09 PM  Result Value Ref Range   WBC 13.8 (H) 4.0 - 10.5 K/uL   RBC 4.43 3.87 - 5.11 MIL/uL   Hemoglobin 12.0 12.0 - 15.0 g/dL   HCT 36.0 36.0 - 46.0 %   MCV 81.3 78.0 - 100.0 fL   MCH 27.1 26.0 - 34.0 pg   MCHC 33.3 30.0 - 36.0 g/dL   RDW 13.4 11.5 - 15.5 %   Platelets 290 150 - 400 K/uL  hCG, quantitative, pregnancy     Status: None   Collection Time: 10/29/16  7:09 PM  Result Value Ref Range   hCG, Beta Chain, Quant, S <1 <5 mIU/mL  ABO/Rh     Status: None   Collection Time: 10/29/16  7:09 PM  Result Value  Ref Range   ABO/RH(D) B POS   hCG, quantitative, pregnancy     Status: None   Collection Time: 10/29/16  9:38 PM  Result Value Ref Range   hCG, Beta Chain, Quant, S <1 <5 mIU/mL    US Ob Comp Less 14 Wks  Result Date: 10/29/2016 CLINICAL DATA:  41 year old pregnant female presents with 2 days of vaginal bleeding and  pelvic cramping. Quantitative beta HCG level is pending. Uncertain LMP. History of Cesarean section. History of D&C 06/22/2016. Reported remote history of endometrial ablation in 2003. EXAM: OBSTETRIC <14 WK [place] AND TRANSVAGINAL OB US TECHNIQUE: Both transabdominal and transvaginal ultrasound examinations were performed for complete evaluation of the gestation as well as the maternal uterus, adnexal regions, and pelvic cul-de-sac. Transvaginal technique was performed to assess early pregnancy. COMPARISON:  09/07/2016 pelvic sonogram. FINDINGS: Enlarged globular anteverted uterus measuring 10.6 x 6.8 x 7.9 cm. Diffuse myometrial heterogeneity with refractory myometrial shadowing, compatible with diffuse adenomyosis of the uterus. No uterine fibroids. Cesarean scar in the anterior lower uterine segment. There is nonspecific simple appearing fluid distending the distorted irregular endometrial cavity. There is a 1.7 x 1.7 x 2.0 cm mass centered in the right cornual region of the uterus, best seen on the transabdominal images due to high position in the pelvis, which demonstrates a thick hyperechoic hypervascular wall and heterogeneous internal echoes, with a possible internal embryo with a crown-rump length of 8.4 mm, which would correlate with a gestational age of [redacted] weeks 5 days. No yolk sac. No embryonic cardiac activity. Right ovary measures 3.0 x 2.2 x 1.7 cm and appears normal. Left ovary measures 3.6 x 1.7 x 2.3 cm and appears normal. No adnexal masses. No abnormal free fluid in the pelvis. IMPRESSION: 1. Right cornual region 2.0 cm uterine mass with thick hyperechoic hypervascular wall and  heterogeneous internal echoes containing a possible 6 week 5 day embryo by crown-rump length. No yolk sac or embryonic cardiac activity. Findings are suspicious for right cornual ectopic pregnancy. 2. Irregular simple fluid distending the distorted irregular endometrial cavity (intrauterine pseudogestational sac). 3. Normal ovaries. No adnexal masses. No abnormal free fluid in the pelvis. 4. Diffuse adenomyosis of the uterus. These results were called by telephone at the time of interpretation on 10/29/2016 at 8:42 pm to [place], who verbally acknowledged these results. Electronically Signed   By: Ilona Sorrel M.D.   On: 10/29/2016 20:46   US Ob Transvaginal  Result Date: 10/29/2016 CLINICAL DATA:  42 year old pregnant female presents with 2 days of vaginal bleeding and pelvic cramping. Quantitative beta HCG level is pending. Uncertain LMP. History of Cesarean section. History of D&C 06/22/2016. Reported remote history of endometrial ablation in 2003. EXAM: OBSTETRIC <14 WK [place] AND TRANSVAGINAL OB US TECHNIQUE: Both transabdominal and transvaginal ultrasound examinations were performed for complete evaluation of the gestation as well as the maternal uterus, adnexal regions, and pelvic cul-de-sac. Transvaginal technique was performed to assess early pregnancy. COMPARISON:  09/07/2016 pelvic sonogram. FINDINGS: Enlarged globular anteverted uterus measuring 10.6 x 6.8 x 7.9 cm. Diffuse myometrial heterogeneity with refractory myometrial shadowing, compatible with diffuse adenomyosis of the uterus. No uterine fibroids. Cesarean scar in the anterior lower uterine segment. There is nonspecific simple appearing fluid distending the distorted irregular endometrial cavity. There is a 1.7 x 1.7 x 2.0 cm mass centered in the right cornual region of the uterus, best seen on the transabdominal images due to high position in the pelvis, which demonstrates a thick hyperechoic hypervascular wall and heterogeneous  internal echoes, with a possible internal embryo with a crown-rump length of 8.4 mm, which would correlate with a gestational age of [redacted] weeks 5 days. No yolk sac. No embryonic cardiac activity. Right ovary measures 3.0 x 2.2 x 1.7 cm and appears normal. Left ovary measures 3.6 x 1.7 x 2.3 cm and appears normal. No adnexal masses. No abnormal free fluid in  the pelvis. IMPRESSION: 1. Right cornual region 2.0 cm uterine mass with thick hyperechoic hypervascular wall and heterogeneous internal echoes containing a possible 6 week 5 day embryo by crown-rump length. No yolk sac or embryonic cardiac activity. Findings are suspicious for right cornual ectopic pregnancy. 2. Irregular simple fluid distending the distorted irregular endometrial cavity (intrauterine pseudogestational sac). 3. Normal ovaries. No adnexal masses. No abnormal free fluid in the pelvis. 4. Diffuse adenomyosis of the uterus. These results were called by telephone at the time of interpretation on 10/29/2016 at 8:42 pm to [place], who verbally acknowledged these results. Electronically Signed   By: Ilona Sorrel M.D.   On: 10/29/2016 20:46   MAU Course  Procedures  MDM Labs and [place] ordered. Transfer of care given to [place], [place]. Julianne Handler, CNM  10/29/2016 8:02 PM   2130: Patient is requesting to go home, and have [place] call her with the results. DW the patient that I do not recommend this as if she has an ectopic pregnancy this could be a potentially fatal condition.  2138: DW Dr. Glo Herring, he agrees that repeat HCG is needed and that patient needs to stay here for the results.   Assessment and Plan   Pelvic pain Abnormal uterine bleeding  DC home Comfort measures reviewed  Bleeding precautions RX: none   Return to MAU as needed FU with OB as planned  Follow-up Information    Emily Filbert, MD Follow up.   Specialty:  Obstetrics and Gynecology Contact information: [place]  [id number] [phone]

## 2016-10-29 NOTE — MAU Note (Signed)
Comfort care reviewed with patient, heart and blanket given. Pt. crying and verbalized grief regarding loss. Nurse at the [facility] consoling patient.

## 2016-11-01 ENCOUNTER — Encounter (HOSPITAL_COMMUNITY)
Admission: RE | Admit: 2016-11-01 | Discharge: 2016-11-01 | Disposition: A | Discharge status: Home / Self Care | Payer: Medicaid Other | Source: Ambulatory Visit | Attending: Obstetrics & Gynecology | Admitting: Obstetrics & Gynecology

## 2016-11-01 ENCOUNTER — Other Ambulatory Visit: Payer: Self-pay

## 2016-11-01 ENCOUNTER — Encounter (HOSPITAL_COMMUNITY): Payer: Self-pay

## 2016-11-01 DIAGNOSIS — N938 Other specified abnormal uterine and vaginal bleeding: Secondary | ICD-10-CM | POA: Insufficient documentation

## 2016-11-01 DIAGNOSIS — Z01818 Encounter for other preprocedural examination: Secondary | ICD-10-CM | POA: Insufficient documentation

## 2016-11-01 HISTORY — DX: Other complications of anesthesia, initial encounter: T88.59XA

## 2016-11-01 HISTORY — DX: Adverse effect of unspecified anesthetic, initial encounter: T41.45XA

## 2016-11-01 LAB — BASIC METABOLIC PANEL
ANION GAP: 8 (ref 5–15)
BUN: 12 mg/dL (ref 6–20)
CALCIUM: 9.4 mg/dL (ref 8.9–10.3)
CO2: 25 mmol/L (ref 22–32)
Chloride: 104 mmol/L (ref 101–111)
Creatinine, Ser: 0.68 mg/dL (ref 0.44–1.00)
GFR calc Af Amer: >60 mL/min (ref >60)
GFR calc non Af Amer: >60 mL/min (ref >60)
GLUCOSE: 229 mg/dL — AB (ref 65–99)
Potassium: 4.1 mmol/L (ref 3.5–5.1)
Sodium: 137 mmol/L (ref 135–145)

## 2016-11-01 LAB — GC/CHLAMYDIA PROBE AMP (~~LOC~~) NOT AT ARMC
Chlamydia: NEGATIVE
Neisseria Gonorrhea: NEGATIVE

## 2016-11-01 LAB — CBC
HEMATOCRIT: 36.4 % (ref 36.0–46.0)
Hemoglobin: 12.4 g/dL (ref 12.0–15.0)
MCH: 27.6 pg (ref 26.0–34.0)
MCHC: 34.1 g/dL (ref 30.0–36.0)
MCV: 80.9 fL (ref 78.0–100.0)
Platelets: 312 10*3/uL (ref 150–400)
RBC: 4.5 MIL/uL (ref 3.87–5.11)
RDW: 13.4 % (ref 11.5–15.5)
WBC: 10.9 10*3/uL — AB (ref 4.0–10.5)

## 2016-11-01 NOTE — Pre-Procedure Instructions (Signed)
Spoke with Dr Smith Robert concerning patient's medical history, EKG and medications.  Patient's CBG at today's PAT appt was 229.  Patient instructed to take DM medications as prescribed except for the night before procedure and morning of procedure.  EKG ok per Dr. Smith Robert.  No orders given.  [place] for surgery.

## 2016-11-11 ENCOUNTER — Ambulatory Visit (HOSPITAL_COMMUNITY): Payer: Medicaid Other | Admitting: Anesthesiology

## 2016-11-11 ENCOUNTER — Ambulatory Visit (HOSPITAL_COMMUNITY)
Admission: RE | Admit: 2016-11-11 | Discharge: 2016-11-11 | Disposition: A | Discharge status: Home / Self Care | Payer: Medicaid Other | Source: Ambulatory Visit | Attending: Obstetrics & Gynecology | Admitting: Obstetrics & Gynecology

## 2016-11-11 ENCOUNTER — Encounter (HOSPITAL_COMMUNITY): Payer: Self-pay | Admitting: Emergency Medicine

## 2016-11-11 ENCOUNTER — Encounter (HOSPITAL_COMMUNITY)
Admission: RE | Disposition: A | Discharge status: Home / Self Care | Payer: Self-pay | Source: Ambulatory Visit | Attending: Obstetrics & Gynecology

## 2016-11-11 DIAGNOSIS — E119 Type 2 diabetes mellitus without complications: Secondary | ICD-10-CM | POA: Diagnosis not present

## 2016-11-11 DIAGNOSIS — Z888 Allergy status to other drugs, medicaments and biological substances status: Secondary | ICD-10-CM | POA: Diagnosis not present

## 2016-11-11 DIAGNOSIS — F5101 Primary insomnia: Secondary | ICD-10-CM | POA: Insufficient documentation

## 2016-11-11 DIAGNOSIS — N920 Excessive and frequent menstruation with regular cycle: Secondary | ICD-10-CM | POA: Diagnosis not present

## 2016-11-11 DIAGNOSIS — Z794 Long term (current) use of insulin: Secondary | ICD-10-CM | POA: Diagnosis not present

## 2016-11-11 DIAGNOSIS — I10 Essential (primary) hypertension: Secondary | ICD-10-CM | POA: Insufficient documentation

## 2016-11-11 DIAGNOSIS — Z880 Allergy status to penicillin: Secondary | ICD-10-CM | POA: Insufficient documentation

## 2016-11-11 DIAGNOSIS — N938 Other specified abnormal uterine and vaginal bleeding: Secondary | ICD-10-CM | POA: Diagnosis present

## 2016-11-11 HISTORY — PX: DILITATION & CURRETTAGE/HYSTROSCOPY WITH HYDROTHERMAL ABLATION: SHX5570

## 2016-11-11 LAB — GLUCOSE, CAPILLARY
GLUCOSE-CAPILLARY: 109 mg/dL — AB (ref 65–99)
GLUCOSE-CAPILLARY: 98 mg/dL (ref 65–99)

## 2016-11-11 LAB — PREGNANCY, URINE: Preg Test, Ur: NEGATIVE

## 2016-11-11 SURGERY — DILATATION & CURETTAGE/HYSTEROSCOPY WITH HYDROTHERMAL ABLATION
Anesthesia: General | Site: Vagina

## 2016-11-11 MED ORDER — FENTANYL CITRATE (PF) 100 MCG/2ML IJ SOLN
INTRAMUSCULAR | Status: AC
  Filled 2016-11-11: qty 2

## 2016-11-11 MED ORDER — OXYCODONE HCL 5 MG PO TABS
ORAL_TABLET | ORAL | Status: AC
  Filled 2016-11-11: qty 1

## 2016-11-11 MED ORDER — LABETALOL HCL 5 MG/ML IV SOLN
INTRAVENOUS | Status: AC
  Filled 2016-11-11: qty 4

## 2016-11-11 MED ORDER — PROPOFOL 10 MG/ML IV BOLUS
INTRAVENOUS | Status: AC
  Filled 2016-11-11: qty 20

## 2016-11-11 MED ORDER — KETOROLAC TROMETHAMINE 30 MG/ML IJ SOLN
INTRAMUSCULAR | Status: DC | PRN
  Administered 2016-11-11: 30 mg via INTRAVENOUS

## 2016-11-11 MED ORDER — ONDANSETRON HCL 4 MG/2ML IJ SOLN
INTRAMUSCULAR | Status: DC | PRN
  Administered 2016-11-11: 4 mg via INTRAVENOUS

## 2016-11-11 MED ORDER — LABETALOL HCL 5 MG/ML IV SOLN
INTRAVENOUS | Status: DC | PRN
  Administered 2016-11-11: 2.5 mg via INTRAVENOUS
  Administered 2016-11-11: 5 mg via INTRAVENOUS
  Administered 2016-11-11: 2.5 mg via INTRAVENOUS

## 2016-11-11 MED ORDER — SCOPOLAMINE 1 MG/3DAYS TD PT72
MEDICATED_PATCH | TRANSDERMAL | Status: DC
Start: 2016-11-11 — End: 2016-11-11
  Administered 2016-11-11: 1.5 mg via TRANSDERMAL
  Filled 2016-11-11: qty 1

## 2016-11-11 MED ORDER — ONDANSETRON HCL 4 MG/2ML IJ SOLN
INTRAMUSCULAR | Status: AC
  Filled 2016-11-11: qty 2

## 2016-11-11 MED ORDER — BUPIVACAINE HCL (PF) 0.5 % IJ SOLN
INTRAMUSCULAR | Status: AC
  Filled 2016-11-11: qty 30

## 2016-11-11 MED ORDER — FENTANYL CITRATE (PF) 100 MCG/2ML IJ SOLN
INTRAMUSCULAR | Status: DC | PRN
  Administered 2016-11-11: 50 ug via INTRAVENOUS

## 2016-11-11 MED ORDER — LABETALOL HCL 5 MG/ML IV SOLN
10.0000 mg | INTRAVENOUS | Status: AC | PRN
  Administered 2016-11-11 (×3): 10 mg via INTRAVENOUS

## 2016-11-11 MED ORDER — BUPIVACAINE HCL (PF) 0.5 % IJ SOLN
INTRAMUSCULAR | Status: DC | PRN
  Administered 2016-11-11: 30 mL

## 2016-11-11 MED ORDER — LIDOCAINE HCL (CARDIAC) 20 MG/ML IV SOLN
INTRAVENOUS | Status: AC
Start: 2016-11-11 — End: 2016-11-11
  Filled 2016-11-11: qty 5

## 2016-11-11 MED ORDER — SCOPOLAMINE 1 MG/3DAYS TD PT72
1.0000 | MEDICATED_PATCH | Freq: Once | TRANSDERMAL | Status: DC
  Administered 2016-11-11: 1.5 mg via TRANSDERMAL

## 2016-11-11 MED ORDER — PROPOFOL 10 MG/ML IV BOLUS
INTRAVENOUS | Status: DC | PRN
  Administered 2016-11-11: 150 mg via INTRAVENOUS

## 2016-11-11 MED ORDER — MIDAZOLAM HCL 2 MG/2ML IJ SOLN
INTRAMUSCULAR | Status: DC | PRN
  Administered 2016-11-11: 2 mg via INTRAVENOUS

## 2016-11-11 MED ORDER — OXYCODONE HCL 5 MG PO TABS
5.0000 mg | ORAL_TABLET | Freq: Once | ORAL | Status: AC | PRN
  Administered 2016-11-11: 5 mg via ORAL

## 2016-11-11 MED ORDER — KETOROLAC TROMETHAMINE 30 MG/ML IJ SOLN
INTRAMUSCULAR | Status: AC
  Filled 2016-11-11: qty 1

## 2016-11-11 MED ORDER — LISINOPRIL 20 MG PO TABS
30.0000 mg | ORAL_TABLET | Freq: Once | ORAL | Status: AC
  Administered 2016-11-11: 12:00:00 30 mg via ORAL
  Filled 2016-11-11: qty 1

## 2016-11-11 MED ORDER — OXYCODONE HCL 5 MG/5ML PO SOLN: 5.0000 mg | Freq: Once | ORAL | Status: AC | PRN

## 2016-11-11 MED ORDER — ONDANSETRON HCL 4 MG/2ML IJ SOLN: 4.0000 mg | Freq: Four times a day (QID) | INTRAMUSCULAR | Status: DC | PRN

## 2016-11-11 MED ORDER — DEXAMETHASONE SODIUM PHOSPHATE 4 MG/ML IJ SOLN
INTRAMUSCULAR | Status: DC | PRN
  Administered 2016-11-11: 4 mg via INTRAVENOUS

## 2016-11-11 MED ORDER — IBUPROFEN 600 MG PO TABS: 600.0000 mg | ORAL_TABLET | Freq: Four times a day (QID) | ORAL | 1 refills | Status: DC | PRN

## 2016-11-11 MED ORDER — SODIUM CHLORIDE 0.9 % IR SOLN
Status: DC | PRN
  Administered 2016-11-11: 3000 mL

## 2016-11-11 MED ORDER — FENTANYL CITRATE (PF) 100 MCG/2ML IJ SOLN: 25.0000 ug | INTRAMUSCULAR | Status: DC | PRN

## 2016-11-11 MED ORDER — MIDAZOLAM HCL 2 MG/2ML IJ SOLN
INTRAMUSCULAR | Status: AC
  Filled 2016-11-11: qty 2

## 2016-11-11 MED ORDER — LACTATED RINGERS IV SOLN
INTRAVENOUS | Status: DC
  Administered 2016-11-11 (×2): via INTRAVENOUS

## 2016-11-11 MED ORDER — PHENYLEPHRINE HCL 10 MG/ML IJ SOLN
INTRAMUSCULAR | Status: DC | PRN
  Administered 2016-11-11 (×3): 50 ug via INTRAVENOUS

## 2016-11-11 MED ORDER — DEXAMETHASONE SODIUM PHOSPHATE 4 MG/ML IJ SOLN
INTRAMUSCULAR | Status: AC
  Filled 2016-11-11: qty 1

## 2016-11-11 MED ORDER — LIDOCAINE HCL (CARDIAC) 20 MG/ML IV SOLN
INTRAVENOUS | Status: DC | PRN
  Administered 2016-11-11: 60 mg via INTRAVENOUS

## 2016-11-11 SURGICAL SUPPLY — 18 items
CANISTER SUCT 3000ML PPV (MISCELLANEOUS) ×3 IMPLANT
CLOTH BEACON ORANGE TIMEOUT ST (SAFETY) ×3 IMPLANT
CONTAINER PREFILL 10% NBF 60ML (FORM) ×6 IMPLANT
DILATOR CANAL MILEX (MISCELLANEOUS) IMPLANT
ELECT REM PT RETURN 9FT ADLT (ELECTROSURGICAL)
ELECTRODE REM PT RTRN 9FT ADLT (ELECTROSURGICAL) IMPLANT
GLOVE BIO SURGEON STRL SZ 6.5 (GLOVE) ×2 IMPLANT
GLOVE BIO SURGEONS STRL SZ 6.5 (GLOVE) ×1
GLOVE BIOGEL PI IND STRL 7.0 (GLOVE) ×1 IMPLANT
GLOVE BIOGEL PI INDICATOR 7.0 (GLOVE) ×2
GOWN STRL REUS W/TWL LRG LVL3 (GOWN DISPOSABLE) ×6 IMPLANT
NEEDLE SPNL 18GX3.5 QUINCKE PK (NEEDLE) ×3 IMPLANT
PACK VAGINAL MINOR WOMEN LF (CUSTOM PROCEDURE TRAY) ×3 IMPLANT
PAD OB MATERNITY 4.3X12.25 (PERSONAL CARE ITEMS) ×3 IMPLANT
PAD PREP 24X48 CUFFED NSTRL (MISCELLANEOUS) ×3 IMPLANT
SET GENESYS HTA PROCERVA (MISCELLANEOUS) ×3 IMPLANT
SYR 30ML LL (SYRINGE) ×3 IMPLANT
TOWEL OR 17X24 6PK STRL BLUE (TOWEL DISPOSABLE) ×6 IMPLANT

## 2016-11-11 NOTE — Anesthesia Postprocedure Evaluation (Signed)
Anesthesia Post Note  Patient: Melanie Luna  Procedure(s) Performed: DILATATION & CURETTAGE/HYSTEROSCOPY WITH HYDROTHERMAL ABLATION (N/A Vagina )     Patient location during evaluation: PACU Anesthesia Type: General Level of consciousness: awake and alert Pain management: pain level controlled Vital Signs Assessment: post-procedure vital signs reviewed and stable Respiratory status: spontaneous breathing, nonlabored ventilation, respiratory function stable and patient connected to nasal cannula oxygen Cardiovascular status: blood pressure returned to baseline and stable Postop Assessment: no apparent nausea or vomiting Anesthetic complications: no    Last Vitals:  Vitals:   11/11/16 1239 11/11/16 1309  BP: (!) 164/89 (!) 166/93  Pulse: 92   Resp: 16 18  Temp: 37.2 C   SpO2: 97% 100%    Last Pain:  Vitals:   11/11/16 1309  TempSrc:   PainSc: 3    Pain Goal: Patients Stated Pain Goal: 3 (11/11/16 1020)               [place]

## 2016-11-11 NOTE — Anesthesia Preprocedure Evaluation (Signed)
Anesthesia Evaluation  Patient identified by MRN, date of birth, ID band Patient awake    Reviewed: Allergy & Precautions, H&P , NPO status , Patient's Chart, lab work & pertinent test results  Airway Mallampati: II   Neck ROM: full    Dental   Pulmonary neg pulmonary ROS,    breath sounds clear to auscultation       Cardiovascular hypertension,  Rhythm:regular Rate:Normal     Neuro/Psych    GI/Hepatic   Endo/Other  diabetes, Type 2  Renal/GU      Musculoskeletal   Abdominal   Peds  Hematology   Anesthesia Other Findings   Reproductive/Obstetrics                             Anesthesia Physical Anesthesia Plan  ASA: II  Anesthesia Plan: General   Post-op Pain Management:    Induction: Intravenous  PONV Risk Score and Plan: 3 and Ondansetron, Dexamethasone, Midazolam and Treatment may vary due to age or medical condition  Airway Management Planned: LMA  Additional Equipment:   Intra-op Plan:   Post-operative Plan:   Informed Consent: I have reviewed the patients History and Physical, chart, labs and discussed the procedure including the risks, benefits and alternatives for the proposed anesthesia with the patient or authorized representative who has indicated his/her understanding and acceptance.     Plan Discussed with: CRNA, Anesthesiologist and Surgeon  Anesthesia Plan Comments:         Anesthesia Quick Evaluation

## 2016-11-11 NOTE — Discharge Instructions (Signed)
Dilation and Curettage or Vacuum Curettage, Care After This sheet gives you information about how to care for yourself after your procedure. Your health care provider may also give you more specific instructions. If you have problems or questions, contact your health care provider. What can I expect after the procedure? After your procedure, it is common to have:  Mild pain or cramping.  Some vaginal bleeding or spotting.  These may last for up to 2 weeks after your procedure. Follow these instructions at home: Activity   Do not drive or use heavy machinery while taking prescription pain medicine.  Avoid driving for the first 24 hours after your procedure.  Take frequent, short walks, followed by rest periods, throughout the day. Ask your health care provider what activities are safe for you. After 1-2 days, you may be able to return to your normal activities.  Do not lift anything heavier than 10 lb (4.5 kg) until your health care provider approves.  For at least 2 weeks, or as long as told by your health care provider, do not: ? Douche. ? Use tampons. ? Have sexual intercourse. General instructions   Take over-the-counter and prescription medicines only as told by your health care provider. This is especially important if you take blood thinning medicine.  Do not take baths, swim, or use a hot tub until your health care provider approves. Take showers instead of baths.  Wear compression stockings as told by your health care provider. These stockings help to prevent blood clots and reduce swelling in your legs.  It is your responsibility to get the results of your procedure. Ask your health care provider, or the department performing the procedure, when your results will be ready.  Keep all follow-up visits as told by your health care provider. This is important. Contact a health care provider if:  You have severe cramps that get worse or that do not get better with  medicine.  You have severe abdominal pain.  You cannot drink fluids without vomiting.  You develop pain in a different area of your pelvis.  You have bad-smelling vaginal discharge.  You have a rash. Get help right away if:  You have vaginal bleeding that soaks more than one sanitary pad in 1 hour, for 2 hours in a row.  You pass large blood clots from your vagina.  You have a fever that is above 100.33F (38.0C).  Your abdomen feels very tender or hard.  You have chest pain.  You have shortness of breath.  You cough up blood.  You feel dizzy or light-headed.  You faint.  You have pain in your neck or shoulder area. This information is not intended to replace advice given to you by your health care provider. Make sure you discuss any questions you have with your health care provider. Document Released: 01/09/2000 Document Revised: 09/10/2015 Document Reviewed: 08/14/2015 Elsevier Interactive Patient Education  2018 [place] INSTRUCTIONS: HYSTEROSCOPY / ENDOMETRIAL ABLATION The following instructions have been prepared to help you care for yourself upon your return home.  May Remove Scop patch on or before  May take Ibuprofen after  May take stool softner while taking narcotic pain medication to prevent constipation.  Drink plenty of water.  Personal hygiene:  Use sanitary pads for vaginal drainage, not tampons.  Shower the day after your procedure.  NO tub baths, pools or Jacuzzis for 2-3 weeks.  Wipe front to back after using the bathroom.  Activity and limitations:  Do NOT drive or operate any equipment for 24 hours. The effects of anesthesia are still present and drowsiness may result.  Do NOT rest in bed all day.  Walking is encouraged.  Walk up and down stairs slowly.  You may resume your normal activity in one to two days or as indicated by your physician. Sexual activity: NO intercourse for at least 2 weeks after the  procedure, or as indicated by your Doctor.  Diet: Eat a light meal as desired this evening. You may resume your usual diet tomorrow.  Return to Work: You may resume your work activities in one to two days or as indicated by Marine scientist.  What to expect after your surgery: Expect to have vaginal bleeding/discharge for 2-3 days and spotting for up to 10 days. It is not unusual to have soreness for up to 1-2 weeks. You may have a slight burning sensation when you urinate for the first day. Mild cramps may continue for a couple of days. You may have a regular period in 2-6 weeks.  Call your doctor for any of the following:  Excessive vaginal bleeding or clotting, saturating and changing one pad every hour.  Inability to urinate 6 hours after discharge from hospital.  Pain not relieved by pain medication.  Fever of 100.4 F or greater.  Unusual vaginal discharge or odor.  Return to office _________________Call for an appointment ___________________ Patients signature: ______________________ Nurses signature ________________________  Post Anesthesia Care Unit [phone]   NO IBUPROFEN PRODUCTS (MOTRIN, ADVIL) OR ALEVE UNTIL 5:00PM TODAY     Post Anesthesia Home Care Instructions  Activity: Get plenty of rest for the remainder of the day. A responsible individual must stay with you for 24 hours following the procedure.  For the next 24 hours, DO NOT: -Drive a car -Paediatric nurse -Drink alcoholic beverages -Take any medication unless instructed by your physician -Make any legal decisions or sign important papers.  Meals: Start with liquid foods such as gelatin or soup. Progress to regular foods as tolerated. Avoid greasy, spicy, heavy foods. If nausea and/or vomiting occur, drink only clear liquids until the nausea and/or vomiting subsides. Call your physician if vomiting continues.  Special Instructions/Symptoms: Your throat may feel dry or sore from the anesthesia  or the breathing tube placed in your throat during surgery. If this causes discomfort, gargle with warm salt water. The discomfort should disappear within 24 hours.  If you had a scopolamine patch placed behind your ear for the management of post- operative nausea and/or vomiting:  1. The medication in the patch is effective for 72 hours, after which it should be removed.  Wrap patch in a tissue and discard in the trash. Wash hands thoroughly with soap and water. 2. You may remove the patch earlier than 72 hours if you experience unpleasant side effects which may include dry mouth, dizziness or visual disturbances. 3. Avoid touching the patch. Wash your hands with soap and water after contact with the patch.

## 2016-11-11 NOTE — Anesthesia Procedure Notes (Signed)
Procedure Name: LMA Insertion Date/Time: 11/11/2016 10:37 AM Performed by: Raenette Rover Pre-anesthesia Checklist: Patient identified, Emergency Drugs available, Suction available and Patient being monitored Patient Re-evaluated:Patient Re-evaluated prior to induction Oxygen Delivery Method: Circle system utilized Preoxygenation: Pre-oxygenation with 100% oxygen Induction Type: IV induction Ventilation: Mask ventilation without difficulty LMA: LMA inserted LMA Size: 4.0 Number of attempts: 1 Placement Confirmation: positive ETCO2,  CO2 detector and breath sounds checked- equal and bilateral Tube secured with: Tape Dental Injury: Teeth and Oropharynx as per pre-operative assessment

## 2016-11-11 NOTE — Op Note (Addendum)
11/11/2016  11:09 AM  PATIENT:  Melanie Luna  41 y.o. female  PRE-OPERATIVE DIAGNOSIS:  DUB  POST-OPERATIVE DIAGNOSIS:  DUB  PROCEDURE:  Procedure(s): DILATATION & CURETTAGE/HYSTEROSCOPY WITH HYDROTHERMAL ABLATION (N/A)  SURGEON:  Surgeon(s) and Role:    * Zaraya Delauder C, MD - Primary  ANESTHESIA:   local and general  EBL:  5 mL   BLOOD ADMINISTERED:none  DRAINS: none   LOCAL MEDICATIONS USED:  MARCAINE     SPECIMEN:  Source of Specimen:  uterine curettings  DISPOSITION OF SPECIMEN:  PATHOLOGY  COUNTS:  YES  TOURNIQUET:  * No tourniquets in log *  DICTATION: .Dragon Dictation  PLAN OF CARE: Discharge to home after PACU  PATIENT DISPOSITION:  PACU - hemodynamically stable.   Delay start of Pharmacological VTE agent (>24hrs) due to surgical blood loss or risk of bleeding: not applicable  The risks, benefits, and alternatives of surgery were explained, understood, and accepted. All questions were answered. Consents were signed. In the operating room general anesthesia was applied without complication, and she was placed in the dorsal lithotomy position. Her vagina was prepped and draped in the usual sterile fashion.  A bimanual exam revealed a normal size and shape, anteverted mobile uterus. Her adnexa were nonenlarged. Her pubic arch is fair if she needs a vaginal hysterectomy in the future. A speculum was placed and a single-tooth tenaculum was used to grasp the anterior lip of her cervix. A total of 30 mL of 0.5% Marcaine was used to perform a paracervical block. Her uterus sounded to 11 cm. Her cervix was carefully and slowly dilated to accommodate a small curette. A curettage was done in all quadrants and the fundus of the uterus. A moderate amount of polypoid-type  tissue was obtained. A gritty sensation was appreciated throughout. I then proceeded with the HTA procedure. Hysteroscopy revealed a shaggy endometrium.   The ostia were both visualized. The procedure proceeded  as expected. There was no bleeding noted at the end of the case. She was taken to the recovery room after being extubated. She tolerated the procedure well.

## 2016-11-11 NOTE — Transfer of Care (Signed)
Immediate Anesthesia Transfer of Care Note  Patient: Melanie Luna  Procedure(s) Performed: DILATATION & CURETTAGE/HYSTEROSCOPY WITH HYDROTHERMAL ABLATION (N/A Vagina )  Patient Location: PACU  Anesthesia Type:General  Level of Consciousness: awake, alert , oriented, drowsy and patient cooperative  Airway & Oxygen Therapy: Patient Spontanous Breathing and Patient connected to nasal cannula oxygen  Post-op Assessment: Report given to RN and Post -op Vital signs reviewed and stable  Post vital signs: Reviewed and stable  Last Vitals:  Vitals:   11/11/16 1020  BP: (!) 183/108  Pulse: 83  Resp: 16  Temp: 36.9 C  SpO2: 100%    Last Pain:  Vitals:   11/11/16 1020  TempSrc: Oral      Patients Stated Pain Goal: 3 (77/41/28 7867)  Complications: No apparent anesthesia complications

## 2016-11-11 NOTE — H&P (Signed)
Melanie Luna is an 41 y.o. AA P3 here because she has continued to bleed heavily since her d&c 5/18 (normal pathology). She had an endometrial ablation about 15 years ago and had no bleeding until about 9 months ago. She is currently taking megace 40 mg QID. She would like another ablation, declines a Mirena. She would like another endometrial ablation.    No LMP recorded (lmp unknown). Patient is not currently having periods (Reason: Other).    Past Medical History:  Diagnosis Date  . Complication of anesthesia    slow to wake up  . Diabetes mellitus    takes insulin - type 2  . Fibroid   . Hypertension    takes meds  . Infection    UTI  . Insomnia   . SVD (spontaneous vaginal delivery)    x 2    Past Surgical History:  Procedure Laterality Date  . CESAREAN SECTION  2003   x 1  . ENDOMETRIAL ABLATION  2003  . HYSTEROSCOPY W/D&C N/A 06/22/2016   Procedure: DILATATION AND CURETTAGE /HYSTEROSCOPY;  Surgeon: Emily Filbert, MD;  Location: [facility];  Service: Gynecology;  Laterality: N/A;  . TUBAL LIGATION  2003    Family History  Problem Relation Age of Onset  . Gout Other   . Diabetes Father   . Heart disease Father   . Diabetes Maternal Grandmother   . Diabetes Maternal Grandfather   . Diabetes Paternal Grandmother   . Diabetes Paternal Grandfather     Social History:  reports that she has never smoked. She has never used smokeless tobacco. She reports that she does not drink alcohol or use drugs.  Allergies:  Allergies  Allergen Reactions  . Codeine Other (See Comments)    Reaction:  Seizures . Pt states she has tolerated Percocet in the past  . Penicillins Other (See Comments)    Reaction:  Seizures  Has patient had a PCN reaction causing immediate rash, facial/tongue/throat swelling, SOB or lightheadedness with hypotension: Yes Has patient had a PCN reaction causing severe rash involving mucus membranes or skin necrosis: No Has patient had a PCN reaction that  required hospitalization: Yes Has patient had a PCN reaction occurring within the last 10 years: No If all of the above answers are "NO", then may proceed with Cephalosporin use.     Prescriptions Prior to Admission  Medication Sig Dispense Refill Last Dose  . insulin aspart (NOVOLOG FLEXPEN) 100 UNIT/ML FlexPen Inject 15 Units into the skin 3 (three) times daily with meals. (Patient taking differently: Inject 15 Units into the skin 3 (three) times daily as needed for high blood sugar. ) 15 mL 0 Taking  . Insulin Degludec (TRESIBA FLEXTOUCH) 200 UNIT/ML SOPN Inject 10 Units into the skin 2 (two) times daily. (Patient taking differently: Inject 40 Units into the skin at bedtime. ) 3 mL 0 Taking  . lisinopril (PRINIVIL,ZESTRIL) 30 MG tablet Take 30 mg by mouth daily.    Taking  . megestrol (MEGACE) 40 MG tablet Take 2 tablets (80 mg total) by mouth 2 (two) times daily. 60 tablet 3     ROS  There were no vitals taken for this visit. Physical Exam  Heart- rrr Lungs- CTAB Abd- benign  No results found for this or any previous visit (from the past 24 hour(s)).  No results found.  Assessment/Plan: DUB- plan for d&c, HTA, hysteroscopy.  She understands the risks of surgery, including, but not to infection, bleeding, DVTs, damage to  bowel, bladder, ureters. She wishes to proceed.     Emily Filbert 11/11/2016, 10:13 AM

## 2016-11-12 ENCOUNTER — Encounter (HOSPITAL_COMMUNITY): Payer: Self-pay | Admitting: Obstetrics & Gynecology

## 2016-11-23 ENCOUNTER — Telehealth: Payer: Self-pay | Admitting: *Deleted

## 2016-11-23 NOTE — Telephone Encounter (Signed)
Patient left message, c/o vaginal odor and discharge.

## 2016-11-24 ENCOUNTER — Inpatient Hospital Stay (HOSPITAL_COMMUNITY)
Admission: AD | Admit: 2016-11-24 | Discharge: 2016-11-24 | Disposition: A | Discharge status: Home / Self Care | Payer: Medicaid Other | Source: Ambulatory Visit | Attending: Obstetrics and Gynecology | Admitting: Obstetrics and Gynecology

## 2016-11-24 ENCOUNTER — Encounter (HOSPITAL_COMMUNITY): Payer: Self-pay | Admitting: Certified Nurse Midwife

## 2016-11-24 DIAGNOSIS — G4700 Insomnia, unspecified: Secondary | ICD-10-CM | POA: Diagnosis not present

## 2016-11-24 DIAGNOSIS — R109 Unspecified abdominal pain: Secondary | ICD-10-CM | POA: Diagnosis present

## 2016-11-24 DIAGNOSIS — Z880 Allergy status to penicillin: Secondary | ICD-10-CM | POA: Insufficient documentation

## 2016-11-24 DIAGNOSIS — N3000 Acute cystitis without hematuria: Secondary | ICD-10-CM | POA: Insufficient documentation

## 2016-11-24 DIAGNOSIS — B9689 Other specified bacterial agents as the cause of diseases classified elsewhere: Secondary | ICD-10-CM | POA: Diagnosis not present

## 2016-11-24 DIAGNOSIS — Z79899 Other long term (current) drug therapy: Secondary | ICD-10-CM | POA: Insufficient documentation

## 2016-11-24 DIAGNOSIS — Z794 Long term (current) use of insulin: Secondary | ICD-10-CM | POA: Diagnosis not present

## 2016-11-24 DIAGNOSIS — Z885 Allergy status to narcotic agent status: Secondary | ICD-10-CM | POA: Diagnosis not present

## 2016-11-24 DIAGNOSIS — N760 Acute vaginitis: Secondary | ICD-10-CM | POA: Insufficient documentation

## 2016-11-24 DIAGNOSIS — E119 Type 2 diabetes mellitus without complications: Secondary | ICD-10-CM | POA: Insufficient documentation

## 2016-11-24 DIAGNOSIS — I10 Essential (primary) hypertension: Secondary | ICD-10-CM | POA: Insufficient documentation

## 2016-11-24 LAB — WET PREP, GENITAL
Sperm: NONE SEEN
Trich, Wet Prep: NONE SEEN
Yeast Wet Prep HPF POC: NONE SEEN

## 2016-11-24 LAB — CBC
HCT: 34.4 % — ABNORMAL LOW (ref 36.0–46.0)
Hemoglobin: 11.2 g/dL — ABNORMAL LOW (ref 12.0–15.0)
MCH: 26.9 pg (ref 26.0–34.0)
MCHC: 32.6 g/dL (ref 30.0–36.0)
MCV: 82.7 fL (ref 78.0–100.0)
Platelets: 293 10*3/uL (ref 150–400)
RBC: 4.16 MIL/uL (ref 3.87–5.11)
RDW: 13.4 % (ref 11.5–15.5)
WBC: 13.6 10*3/uL — ABNORMAL HIGH (ref 4.0–10.5)

## 2016-11-24 LAB — URINALYSIS, ROUTINE W REFLEX MICROSCOPIC
BILIRUBIN URINE: NEGATIVE
HGB URINE DIPSTICK: NEGATIVE
KETONES UR: NEGATIVE mg/dL
LEUKOCYTES UA: NEGATIVE
NITRITE: POSITIVE — AB
PH: 5 (ref 5.0–8.0)
Protein, ur: NEGATIVE mg/dL
RBC / HPF: NONE SEEN RBC/hpf (ref 0–5)
SPECIFIC GRAVITY, URINE: 1.025 (ref 1.005–1.030)

## 2016-11-24 MED ORDER — METRONIDAZOLE 500 MG PO TABS: 500.0000 mg | ORAL_TABLET | Freq: Two times a day (BID) | ORAL | 2 refills | Status: DC

## 2016-11-24 MED ORDER — SULFAMETHOXAZOLE-TRIMETHOPRIM 400-80 MG PO TABS: 1.0000 | ORAL_TABLET | Freq: Two times a day (BID) | ORAL | 0 refills | Status: DC

## 2016-11-24 NOTE — Discharge Instructions (Signed)
Some natural remedies/prevention to try for bacterial vaginosis: Take a probiotic tablet/capsule every day for at least 1-2 months.   Whenever you have symptoms, use boric acid suppositories vaginally every night for a week.   Do not use scented soaps/perfumes in the vaginal area and wear breathable cotton underwear and do not wear tight restrictive clothing.   In late 2019, the [facility] will be moving to the [place]. At that time, the MAU will no longer serve non-pregnant patients. We encourage you to establish care with a provider before that time, so that you can be seen with any GYN concerns, like vaginal discharge, urinary tract infection, etc.. in a timely manner. In order to make the office visit more convenient, the [facility] [place] at [facility] will be offering evening hours from 4pm-7:30pm on Mondays starting 10/04/16. There will be same-day appointments, walk-in appointments and scheduled appointments available during this time.    [facility] [place] @ [facility] [phone]  For urgent needs, [facility] is also available for management of urgent GYN complaints such as vaginal discharge.   [employer] extends eligibility for family planning services to reduce unintended pregnancies and improve the well-being of children and families.   Eligible individuals whose income is at or below 195% of the federal poverty level and who are:  - [place] citizens, documented immigrants or qualified aliens;  - Residents of [place];  - Not incarcerated; and  - Not pregnant.   [employer] Family Planning Contact Information:  Medical Assistance Clinical Section Phone: [phone] Email: [email or web address]

## 2016-11-24 NOTE — MAU Provider Note (Signed)
Chief Complaint: Abdominal Pain   First Provider Initiated Contact with Patient 11/24/16 0104      SUBJECTIVE HPI: Melanie Luna is a 41 y.o. [id number] who presents to maternity admissions reporting abdominal pain, vaginal discharge with odor, and desire for STD testing. She reports being monogamous with one partner x 8 months.  She recently had AUB treated with ablation 2 weeks ago and has concerns that her partner could have been unfaithful when she could not have sex related to surgery.  She has hx of bacterial vaginosis and feels like she has symptoms of this again. She is diabetic but has not been eating well and knows this can lead to UTI for her.  She has not tried any treatments, nothing makes her symptoms better or worse. There are no other associated symptoms.  She denies vaginal bleeding, vaginal itching/burning, h/a, dizziness, n/v, or fever/chills.     HPI  Past Medical History:  Diagnosis Date  . Complication of anesthesia    slow to wake up  . Diabetes mellitus    takes insulin - type 2  . Fibroid   . Hypertension    takes meds  . Infection    UTI  . Insomnia   . SVD (spontaneous vaginal delivery)    x 2   Past Surgical History:  Procedure Laterality Date  . CESAREAN SECTION  2003   x 1  . DILITATION & CURRETTAGE/HYSTROSCOPY WITH HYDROTHERMAL ABLATION N/A 11/11/2016   Procedure: DILATATION & CURETTAGE/HYSTEROSCOPY WITH HYDROTHERMAL ABLATION;  Surgeon: Emily Filbert, MD;  Location: [facility];  Service: Gynecology;  Laterality: N/A;  . ENDOMETRIAL ABLATION  2003  . HYSTEROSCOPY W/D&C N/A 06/22/2016   Procedure: DILATATION AND CURETTAGE /HYSTEROSCOPY;  Surgeon: Emily Filbert, MD;  Location: [facility];  Service: Gynecology;  Laterality: N/A;  . TUBAL LIGATION  2003   Social History   Social History  . Marital status: Single    Spouse name: N/A  . Number of children: N/A  . Years of education: N/A   Occupational History  . Not on file.   Social History Main Topics  .  Smoking status: Never Smoker  . Smokeless tobacco: Never Used  . Alcohol use No  . Drug use: No  . Sexual activity: Yes    Birth control/ protection: Surgical   Other Topics Concern  . Not on file   Social History Narrative  . No narrative on file   No current facility-administered medications on file prior to encounter.    Current Outpatient Prescriptions on File Prior to Encounter  Medication Sig Dispense Refill  . ibuprofen (ADVIL,MOTRIN) 600 MG tablet Take 1 tablet (600 mg total) by mouth every 6 (six) hours as needed. 30 tablet 1  . insulin aspart (NOVOLOG FLEXPEN) 100 UNIT/ML FlexPen Inject 15 Units into the skin 3 (three) times daily with meals. (Patient taking differently: Inject 15 Units into the skin 3 (three) times daily as needed for high blood sugar. ) 15 mL 0  . Insulin Degludec (TRESIBA FLEXTOUCH) 200 UNIT/ML SOPN Inject 10 Units into the skin 2 (two) times daily. (Patient taking differently: Inject 40 Units into the skin at bedtime. ) 3 mL 0  . lisinopril (PRINIVIL,ZESTRIL) 30 MG tablet Take 30 mg by mouth daily.      Allergies  Allergen Reactions  . Codeine Other (See Comments)    Reaction:  Seizures . Pt states she has tolerated Percocet in the past  . Penicillins Other (See Comments)  Reaction:  Seizures  Has patient had a PCN reaction causing immediate rash, facial/tongue/throat swelling, SOB or lightheadedness with hypotension: Yes Has patient had a PCN reaction causing severe rash involving mucus membranes or skin necrosis: No Has patient had a PCN reaction that required hospitalization: Yes Has patient had a PCN reaction occurring within the last 10 years: No If all of the above answers are "NO", then may proceed with Cephalosporin use.     ROS:  Review of Systems  Constitutional: Negative for chills, fatigue and fever.  Respiratory: Negative for shortness of breath.   Cardiovascular: Negative for chest pain.  Gastrointestinal: Positive for abdominal  pain.  Genitourinary: Positive for pelvic pain and vaginal discharge. Negative for difficulty urinating, dysuria, flank pain, vaginal bleeding and vaginal pain.  Neurological: Negative for dizziness and headaches.  Psychiatric/Behavioral: Negative.      I have reviewed patient's Past Medical Hx, Surgical Hx, Family Hx, Social Hx, medications and allergies.   Physical Exam  Patient Vitals for the past 24 hrs:  BP Temp Temp src Pulse Resp  11/24/16 0041 (!) 154/89 98.4 F (36.9 C) Oral 97 16   Constitutional: Well-developed, well-nourished female in no acute distress.  Cardiovascular: normal rate Respiratory: normal effort GI: Abd soft, non-tender. Pos BS x 4 MS: Extremities nontender, no edema, normal ROM Neurologic: Alert and oriented x 4.  GU: Neg CVAT.  PELVIC EXAM: Cervix pink, visually closed, without lesion, moderate thin white frothy discharge, vaginal walls and external genitalia normal Bimanual exam: Cervix 0/long/high, firm, anterior, neg CMT, uterus nontender, nonenlarged, adnexa without tenderness, enlargement, or mass   LAB RESULTS Results for orders placed or performed during the hospital encounter of 11/24/16 (from the past 24 hour(s))  Urinalysis, Routine w reflex microscopic     Status: Abnormal   Collection Time: 11/24/16 12:15 AM  Result Value Ref Range   Color, Urine YELLOW YELLOW   APPearance CLEAR CLEAR   Specific Gravity, Urine 1.025 1.005 - 1.030   pH 5.0 5.0 - 8.0   Glucose, UA >=500 (A) NEGATIVE mg/dL   Hgb urine dipstick NEGATIVE NEGATIVE   Bilirubin Urine NEGATIVE NEGATIVE   Ketones, ur NEGATIVE NEGATIVE mg/dL   Protein, ur NEGATIVE NEGATIVE mg/dL   Nitrite POSITIVE (A) NEGATIVE   Leukocytes, UA NEGATIVE NEGATIVE   RBC / HPF NONE SEEN 0 - 5 RBC/hpf   WBC, UA 0-5 0 - 5 WBC/hpf   Bacteria, UA MANY (A) NONE SEEN   Squamous Epithelial / LPF 0-5 (A) NONE SEEN   Mucus PRESENT   CBC     Status: Abnormal   Collection Time: 11/24/16  1:06 AM  Result  Value Ref Range   WBC 13.6 (H) 4.0 - 10.5 K/uL   RBC 4.16 3.87 - 5.11 MIL/uL   Hemoglobin 11.2 (L) 12.0 - 15.0 g/dL   HCT 34.4 (L) 36.0 - 46.0 %   MCV 82.7 78.0 - 100.0 fL   MCH 26.9 26.0 - 34.0 pg   MCHC 32.6 30.0 - 36.0 g/dL   RDW 13.4 11.5 - 15.5 %   Platelets 293 150 - 400 K/uL  Wet prep, genital     Status: Abnormal   Collection Time: 11/24/16  1:30 AM  Result Value Ref Range   Yeast Wet Prep HPF POC NONE SEEN NONE SEEN   Trich, Wet Prep NONE SEEN NONE SEEN   Clue Cells Wet Prep HPF POC PRESENT (A) NONE SEEN   WBC, Wet Prep HPF POC MODERATE (A) NONE SEEN  Sperm NONE SEEN     --/--/B POS (10/05 1909)  IMAGING   MAU Management/MDM: Ordered labs and reviewed results.  UTI and BV noted on labwork.  Pelvic and abdominal exams unremarkable.  Will treat UTI with Bactrim DS BID x 7 days and BV with Flagyl 500 mg BID x 7 days. Discussed prevention of BV including probiotics, decreased use of soaps/perfumes, and use of breathable cotton underwear.  Pt to f/u in [facility] office or return to MAU for emergencies.  STD testing pending.   Pt discharged with strict infection precautions.  ASSESSMENT 1. Acute cystitis without hematuria   2. BV (bacterial vaginosis)     PLAN Discharge home Allergies as of 11/24/2016      Reactions   Codeine Other (See Comments)   Reaction:  Seizures . Pt states she has tolerated Percocet in the past   Penicillins Other (See Comments)   Reaction:  Seizures  Has patient had a PCN reaction causing immediate rash, facial/tongue/throat swelling, SOB or lightheadedness with hypotension: Yes Has patient had a PCN reaction causing severe rash involving mucus membranes or skin necrosis: No Has patient had a PCN reaction that required hospitalization: Yes Has patient had a PCN reaction occurring within the last 10 years: No If all of the above answers are "NO", then may proceed with Cephalosporin use.      Medication List    TAKE these medications    ibuprofen 600 MG tablet Commonly known as:  ADVIL,MOTRIN Take 1 tablet (600 mg total) by mouth every 6 (six) hours as needed.   insulin aspart 100 UNIT/ML FlexPen Commonly known as:  NOVOLOG FLEXPEN Inject 15 Units into the skin 3 (three) times daily with meals. What changed:  when to take this  reasons to take this   Insulin Degludec 200 UNIT/ML Sopn Commonly known as:  TRESIBA FLEXTOUCH Inject 10 Units into the skin 2 (two) times daily. What changed:  how much to take  when to take this   lisinopril 30 MG tablet Commonly known as:  PRINIVIL,ZESTRIL Take 30 mg by mouth daily.   metroNIDAZOLE 500 MG tablet Commonly known as:  FLAGYL Take 1 tablet (500 mg total) by mouth 2 (two) times daily.      Follow-up [place] [facility] [place] up.   Specialty:  Obstetrics and Gynecology Why:  For routine Gyn care, return to MAU as needed for emergencies Contact information: [place] [facility] Follow up.   Specialty:  Internal Medicine Why:  To manage diabetes/high blood pressure Contact information: [place] [address] [place] Certified Nurse-Midwife 11/24/2016  2:07 AM

## 2016-11-24 NOTE — MAU Note (Signed)
Patient presents to MAU with c/o abdominal and pelvic pain. Patient had surgery on the 18th of Oct. Patient thinks "it is a bacterial infection or a urinary infection".

## 2016-11-25 LAB — GC/CHLAMYDIA PROBE AMP (~~LOC~~) NOT AT ARMC
CHLAMYDIA, DNA PROBE: NEGATIVE
NEISSERIA GONORRHEA: NEGATIVE

## 2016-12-03 NOTE — Telephone Encounter (Signed)
Seen in MAU on 11/24/16.

## 2016-12-06 ENCOUNTER — Other Ambulatory Visit: Payer: Self-pay | Admitting: Obstetrics & Gynecology

## 2016-12-08 ENCOUNTER — Ambulatory Visit: Payer: Medicaid Other | Admitting: Obstetrics & Gynecology

## 2016-12-22 ENCOUNTER — Ambulatory Visit: Payer: Medicaid Other | Admitting: Obstetrics & Gynecology

## 2016-12-22 ENCOUNTER — Encounter: Payer: Self-pay | Admitting: Obstetrics & Gynecology

## 2016-12-22 VITALS — BP 162/107 | HR 88 | Wt 164.9 lb

## 2016-12-22 DIAGNOSIS — Z9889 Other specified postprocedural states: Secondary | ICD-10-CM

## 2016-12-22 DIAGNOSIS — R6889 Other general symptoms and signs: Secondary | ICD-10-CM

## 2016-12-22 NOTE — Progress Notes (Signed)
   Subjective:    Patient ID: Melanie Luna, female    DOB: [birth year], 41 y.o.   MRN: [id number]  HPI  41 yo lady here 6 weeks after a repeat ablation (her first one lasted 15 years before she began bleeding again). She has not bled since surgery!!  She reports cold intolerance, denies any other problems.  Review of Systems She has had a BTL and uses condoms for STI prevention.    Objective:   Physical Exam Breathing, conversing, and ambulating normally        Assessment & Plan:  Elevated BP on lisinopril- she will go back to [facility] today Cold intolerance- check TSH She declines a flu vaccine

## 2016-12-23 LAB — TSH: TSH: 1.44 u[IU]/mL (ref 0.450–4.500)

## 2017-01-12 ENCOUNTER — Encounter (HOSPITAL_COMMUNITY): Payer: Self-pay | Admitting: Emergency Medicine

## 2017-01-12 ENCOUNTER — Emergency Department (HOSPITAL_BASED_OUTPATIENT_CLINIC_OR_DEPARTMENT_OTHER)
Admit: 2017-01-12 | Discharge: 2017-01-12 | Disposition: A | Discharge status: Home / Self Care | Payer: Medicaid Other | Attending: Emergency Medicine | Admitting: Emergency Medicine

## 2017-01-12 ENCOUNTER — Other Ambulatory Visit: Payer: Self-pay

## 2017-01-12 ENCOUNTER — Emergency Department (HOSPITAL_COMMUNITY)
Admission: EM | Admit: 2017-01-12 | Discharge: 2017-01-12 | Disposition: A | Discharge status: Home / Self Care | Payer: Medicaid Other | Attending: Emergency Medicine | Admitting: Emergency Medicine

## 2017-01-12 ENCOUNTER — Emergency Department (HOSPITAL_COMMUNITY): Payer: Medicaid Other

## 2017-01-12 DIAGNOSIS — Z79899 Other long term (current) drug therapy: Secondary | ICD-10-CM | POA: Insufficient documentation

## 2017-01-12 DIAGNOSIS — I10 Essential (primary) hypertension: Secondary | ICD-10-CM | POA: Insufficient documentation

## 2017-01-12 DIAGNOSIS — X58XXXA Exposure to other specified factors, initial encounter: Secondary | ICD-10-CM | POA: Diagnosis not present

## 2017-01-12 DIAGNOSIS — T148XXA Other injury of unspecified body region, initial encounter: Secondary | ICD-10-CM

## 2017-01-12 DIAGNOSIS — Y999 Unspecified external cause status: Secondary | ICD-10-CM | POA: Diagnosis not present

## 2017-01-12 DIAGNOSIS — Y929 Unspecified place or not applicable: Secondary | ICD-10-CM | POA: Diagnosis not present

## 2017-01-12 DIAGNOSIS — S76911A Strain of unspecified muscles, fascia and tendons at thigh level, right thigh, initial encounter: Secondary | ICD-10-CM | POA: Diagnosis not present

## 2017-01-12 DIAGNOSIS — M79609 Pain in unspecified limb: Secondary | ICD-10-CM

## 2017-01-12 DIAGNOSIS — M79604 Pain in right leg: Secondary | ICD-10-CM

## 2017-01-12 DIAGNOSIS — Z794 Long term (current) use of insulin: Secondary | ICD-10-CM | POA: Diagnosis not present

## 2017-01-12 DIAGNOSIS — Y9389 Activity, other specified: Secondary | ICD-10-CM | POA: Diagnosis not present

## 2017-01-12 DIAGNOSIS — E119 Type 2 diabetes mellitus without complications: Secondary | ICD-10-CM | POA: Insufficient documentation

## 2017-01-12 LAB — CBC WITH DIFFERENTIAL/PLATELET
BASOS PCT: 0 %
Basophils Absolute: 0 10*3/uL (ref 0.0–0.1)
EOS ABS: 0.1 10*3/uL (ref 0.0–0.7)
EOS PCT: 1 %
HCT: 38 % (ref 36.0–46.0)
Hemoglobin: 12.5 g/dL (ref 12.0–15.0)
LYMPHS ABS: 3.4 10*3/uL (ref 0.7–4.0)
Lymphocytes Relative: 36 %
MCH: 27.5 pg (ref 26.0–34.0)
MCHC: 32.9 g/dL (ref 30.0–36.0)
MCV: 83.5 fL (ref 78.0–100.0)
MONO ABS: 0.6 10*3/uL (ref 0.1–1.0)
Monocytes Relative: 7 %
Neutro Abs: 5.4 10*3/uL (ref 1.7–7.7)
Neutrophils Relative %: 56 %
Platelets: 281 10*3/uL (ref 150–400)
RBC: 4.55 MIL/uL (ref 3.87–5.11)
RDW: 13.1 % (ref 11.5–15.5)
WBC: 9.6 10*3/uL (ref 4.0–10.5)

## 2017-01-12 LAB — BASIC METABOLIC PANEL
Anion gap: 7 (ref 5–15)
BUN: 12 mg/dL (ref 6–20)
CALCIUM: 8.9 mg/dL (ref 8.9–10.3)
CO2: 28 mmol/L (ref 22–32)
CREATININE: 0.7 mg/dL (ref 0.44–1.00)
Chloride: 100 mmol/L — ABNORMAL LOW (ref 101–111)
GFR calc non Af Amer: >60 mL/min (ref >60)
Glucose, Bld: 257 mg/dL — ABNORMAL HIGH (ref 65–99)
Potassium: 4.2 mmol/L (ref 3.5–5.1)
SODIUM: 135 mmol/L (ref 135–145)

## 2017-01-12 LAB — I-STAT BETA HCG BLOOD, ED (MC, WL, AP ONLY)

## 2017-01-12 LAB — CK: CK TOTAL: 66 U/L (ref 38–234)

## 2017-01-12 MED ORDER — HYDROCODONE-ACETAMINOPHEN 5-325 MG PO TABS
1.0000 | ORAL_TABLET | Freq: Once | ORAL | Status: AC
  Administered 2017-01-12: 1 via ORAL
  Filled 2017-01-12: qty 1

## 2017-01-12 MED ORDER — IBUPROFEN 400 MG PO TABS
400.0000 mg | ORAL_TABLET | Freq: Once | ORAL | Status: AC | PRN
  Administered 2017-01-12: 400 mg via ORAL
  Filled 2017-01-12: qty 1

## 2017-01-12 NOTE — Discharge Instructions (Addendum)
The ultrasound of your leg does not show a blood clot Baker's cyst, or other abnormalities Your x-rays of the leg overall reassuring.  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  There is no signs of muscle breakdown.   Please follow-up with your primary care doctor tomorrow as scheduled.  Continue ibuprofen 6-800 mg every 6 hours as needed for pain.  Ice or use heat packs to the area.  Please return without fail for worsening symptoms, including fever, leg swelling, escalating pain, or any other symptoms concerning to you.

## 2017-01-12 NOTE — ED Provider Notes (Signed)
[facility] EMERGENCY DEPARTMENT Provider Note   CSN: [id number] Arrival date & time: 01/12/17  1030     History   Chief Complaint Chief Complaint  Patient presents with  . Leg Pain    HPI Melanie Luna is a 41 y.o. female.  The history is provided by the patient.  Leg Pain   This is a new problem. The current episode started more than 2 days ago. The problem occurs constantly. The problem has been gradually worsening. The pain is present in the right upper leg. The quality of the pain is described as aching. The pain is severe. Associated symptoms include limited range of motion and stiffness. Pertinent negatives include no numbness and no tingling. The symptoms are aggravated by activity, standing and contact. She has tried OTC pain medications for the symptoms. The treatment provided no relief. There has been no history of extremity trauma.   41 year old female who presents with right upper leg pain.  She has a history of diabetes and hypertension.  States that she was started on amlodipine earlier this month  No other new medications.  About 3 days ago initially noted some intermittent aching in her right thigh.  Pain since then has been more constant and more severe.  Worse with ambulation, palpation, or any weightbearing.  Not improved by ibuprofen or other over-the-counter medications.  Not improved by heat packs or warm baths.  No trauma, exertional activity, heavy lifting or known injury.  No fevers or chills.  No leg swelling, bruising, or discoloration.  No chest pains, difficulty breathing, vomiting.   Past Medical History:  Diagnosis Date  . Complication of anesthesia    slow to wake up  . Diabetes mellitus    takes insulin - type 2  . Fibroid   . Hypertension    takes meds  . Infection    UTI  . Insomnia   . SVD (spontaneous vaginal delivery)    x 2    Patient Active Problem List   Diagnosis Date Noted  . Abnormal uterine bleeding (AUB)  08/10/2016    Past Surgical History:  Procedure Laterality Date  . CESAREAN SECTION  2003   x 1  . DILITATION & CURRETTAGE/HYSTROSCOPY WITH HYDROTHERMAL ABLATION N/A 11/11/2016   Procedure: DILATATION & CURETTAGE/HYSTEROSCOPY WITH HYDROTHERMAL ABLATION;  Surgeon: Emily Filbert, MD;  Location: [facility];  Service: Gynecology;  Laterality: N/A;  . ENDOMETRIAL ABLATION  2003  . HYSTEROSCOPY W/D&C N/A 06/22/2016   Procedure: DILATATION AND CURETTAGE /HYSTEROSCOPY;  Surgeon: Emily Filbert, MD;  Location: [facility];  Service: Gynecology;  Laterality: N/A;  . TUBAL LIGATION  2003    OB History    Gravida Para Term Preterm AB Living   5 3 3   1 3    SAB TAB Ectopic Multiple Live Births     1     3       Home Medications    Prior to Admission medications   Medication Sig Start Date End Date Taking? Authorizing Provider  ibuprofen (ADVIL,MOTRIN) 600 MG tablet Take 1 tablet (600 mg total) by mouth every 6 (six) hours as needed. Patient taking differently: Take 600 mg by mouth every 6 (six) hours as needed for moderate pain.  11/11/16  Yes Dove, Myra C, MD  insulin aspart (NOVOLOG FLEXPEN) 100 UNIT/ML FlexPen Inject 15 Units into the skin 3 (three) times daily with meals. Patient taking differently: Inject 15 Units into the skin 3 (three) times daily.  03/04/16  Yes West, Emily, PA-C  Insulin Degludec (TRESIBA FLEXTOUCH) 200 UNIT/ML SOPN Inject 10 Units into the skin 2 (two) times daily. Patient taking differently: Inject 40 Units into the skin at bedtime.  03/04/16  Yes West, Emily, PA-C  lisinopril (PRINIVIL,ZESTRIL) 40 MG tablet Take 40 mg by mouth daily.   Yes [provider]    Family History Family History  Problem Relation Age of Onset  . Gout Other   . Diabetes Father   . Heart disease Father   . Diabetes Maternal Grandmother   . Diabetes Maternal Grandfather   . Diabetes Paternal Grandmother   . Diabetes Paternal Grandfather     Social History Social History   Tobacco Use    . Smoking status: Never Smoker  . Smokeless tobacco: Never Used  Substance Use Topics  . Alcohol use: No  . Drug use: No     Allergies   Codeine and Penicillins   Review of Systems Review of Systems  Constitutional: Negative for fever.  Respiratory: Negative for shortness of breath.   Cardiovascular: Negative for chest pain and leg swelling.  Gastrointestinal: Negative for abdominal pain.  Musculoskeletal: Positive for stiffness.       Right thigh pain   Neurological: Negative for tingling and numbness.  All other systems reviewed and are negative.    Physical Exam Updated Vital Signs BP 139/85   Pulse 89   Temp 98.4 F (36.9 C) (Oral)   Resp 18   Ht 5' (1.524 m)   Wt 73.5 kg (162 lb)   LMP  (LMP Unknown) Comment: had ablation 2003, had D&C 5/29- has been bleeding off and on since then  SpO2 100%   BMI 31.64 kg/m   Physical Exam Physical Exam  Nursing note and vitals reviewed. Constitutional: Well developed, well nourished, non-toxic, and in no acute distress Head: Normocephalic and atraumatic.  Mouth/Throat: Oropharynx is clear and moist.  Neck: Normal range of motion. Neck supple.  Cardiovascular: Normal rate and regular rhythm.  +2 DP pulses Pulmonary/Chest: Effort normal and breath sounds normal.  Abdominal: Soft. There is no tenderness. There is no rebound and no guarding.  Musculoskeletal: No obvious leg swelling, bruising or overlying skin discoloration. Tenderness to palpation of the anterior right thigh over quadricepts muscle.  Neurological: Alert, no facial droop, fluent speech, sensation intact in bilateral lower extremities, difficult motor exam in the right lower extremity due to pain.  Full strength bilateral ankle dorsi/plantar flexion distally. Skin: Skin is warm and dry.  Psychiatric: Cooperative   ED Treatments / Results  Labs (all labs ordered are listed, but only abnormal results are displayed) Labs Reviewed  BASIC METABOLIC PANEL -  Abnormal; Notable for the following components:      Result Value   Chloride 100 (*)    Glucose, Bld 257 (*)    All other components within normal limits  CBC WITH DIFFERENTIAL/PLATELET  CK  I-STAT BETA HCG BLOOD, ED ([facility], [facility], [facility] ONLY)    EKG  EKG Interpretation None       Radiology Dg Pelvis 1-2 Views  Result Date: 01/12/2017 CLINICAL DATA:  41 year old female with a history of leg pain EXAM: PELVIS - 1-2 VIEW COMPARISON:  None. FINDINGS: Bony pelvic ring intact. No acute fracture line identified. Bilateral hips projects normally over the acetabula. Mild degenerative changes of the hips. Pelvic phleboliths. IMPRESSION: Negative for acute bony abnormality. Mild bilateral hip degenerative changes. Electronically Signed   By: Corrie Mckusick D.O.  On: 01/12/2017 18:15   Dg Femur Min 2 Views Right  Result Date: 01/12/2017 CLINICAL DATA:  41 year old female with a history of right leg pain EXAM: RIGHT FEMUR 2 VIEWS COMPARISON:  None. FINDINGS: There is no evidence of fracture or other focal bone lesions. Soft tissues are unremarkable. IMPRESSION: Negative for acute bony abnormality Electronically Signed   By: Corrie Mckusick D.O.   On: 01/12/2017 18:14    Procedures Procedures (including critical care time)  Medications Ordered in ED Medications  ibuprofen (ADVIL,MOTRIN) tablet 400 mg (400 mg Oral Given 01/12/17 1218)  HYDROcodone-acetaminophen (NORCO/VICODIN) 5-325 MG per tablet 1 tablet (1 tablet Oral Given 01/12/17 1620)     Initial Impression / Assessment and Plan / ED Course  I have reviewed the triage vital signs and the nursing notes.  Pertinent labs & imaging results that were available during my care of the patient were reviewed by me and considered in my medical decision making (see chart for details).     Presents with right anterior thigh pain.  Is nontoxic in no acute distress normal vital signs.  No significant leg swelling, skin discoloration, bruising or  erythema.  Extremity is grossly neurovascularly intact.  Her compartments are soft.  No concerns for compartment syndrome.  Blood work shows no major electrolyte or metabolic derangements.  With normal CK.  Ultrasound of her right lower extremity does not feel evidence of DVT or Baker's cyst.  X-rays show no major findings aside from mild arthritis of the right hip.  Presentation today may be suggestive of potential muscle strain.  She will be discharged with anti-inflammatory medications and supportive care measures.  She does have follow-up appointment tomorrow with her PCP.  Final Clinical Impressions(s) / ED Diagnoses   Final diagnoses:  Right leg pain  Muscle strain    ED Discharge Orders    None       Forde Dandy, MD 01/12/17 1827

## 2017-01-12 NOTE — ED Notes (Signed)
Ortho en route for pt education and crutch fitting

## 2017-01-12 NOTE — ED Triage Notes (Signed)
Pt reports R thigh pain 2 days after starting amlodipine. Pt states, "I was in too much pain to walk on it."  Pt denies redness, swelling to thigh, states, "it's just stiff."

## 2017-01-12 NOTE — Progress Notes (Signed)
*  PRELIMINARY RESULTS* Vascular Ultrasound Right lower extremity venous duplex has been completed.  Preliminary findings: No evidence of deep vein thrombosis or baker's cyst in the right lower extremity.   Everrett Coombe 01/12/2017, 5:45 PM

## 2017-01-12 NOTE — ED Notes (Signed)
Pt up to nurse first again stating she is tired of waiting and does not understand how we let her wait so long without anything for pain. Pt advised she will be one of the next few people to go back if she is willing to wait. Pt stated "I better not get back to a room and have to wait or yall are going to be sorry." this RN advised pt a provider would see her as soon as possible once we had a room to put her in, this RN inquired if pt was willing to continue waiting for room at this time, pt stated she would wait until 3 and then she was going to leave. This RN apologized for pts wait time and made her aware we were working to get her back as soon as possible.

## 2017-01-12 NOTE — ED Notes (Signed)
Patient transported to X-ray 

## 2017-01-12 NOTE — ED Notes (Signed)
Pt up to nurse first stating she is tired of waiting and is going to leave, this RN advised pt that we are working to get her back as soon as possible if she is willing to wait a little longer. Pt agreed to wait

## 2017-09-09 ENCOUNTER — Emergency Department (HOSPITAL_COMMUNITY)
Admission: EM | Admit: 2017-09-09 | Discharge: 2017-09-09 | Disposition: A | Discharge status: Home / Self Care | Payer: Medicaid Other | Attending: Emergency Medicine | Admitting: Emergency Medicine

## 2017-09-09 ENCOUNTER — Other Ambulatory Visit: Payer: Self-pay

## 2017-09-09 DIAGNOSIS — R1110 Vomiting, unspecified: Secondary | ICD-10-CM | POA: Insufficient documentation

## 2017-09-09 DIAGNOSIS — R079 Chest pain, unspecified: Secondary | ICD-10-CM | POA: Insufficient documentation

## 2017-09-09 DIAGNOSIS — R05 Cough: Secondary | ICD-10-CM | POA: Diagnosis not present

## 2017-09-09 DIAGNOSIS — I10 Essential (primary) hypertension: Secondary | ICD-10-CM | POA: Diagnosis not present

## 2017-09-09 DIAGNOSIS — E119 Type 2 diabetes mellitus without complications: Secondary | ICD-10-CM | POA: Diagnosis not present

## 2017-09-09 DIAGNOSIS — N390 Urinary tract infection, site not specified: Secondary | ICD-10-CM | POA: Insufficient documentation

## 2017-09-09 DIAGNOSIS — R059 Cough, unspecified: Secondary | ICD-10-CM

## 2017-09-09 LAB — PREGNANCY, URINE: Preg Test, Ur: NEGATIVE

## 2017-09-09 LAB — URINALYSIS, ROUTINE W REFLEX MICROSCOPIC
Bilirubin Urine: NEGATIVE
Glucose, UA: >=500 mg/dL — AB
Hgb urine dipstick: NEGATIVE
Ketones, ur: 5 mg/dL — AB
Nitrite: POSITIVE — AB
Protein, ur: NEGATIVE mg/dL
Specific Gravity, Urine: 1.022 (ref 1.005–1.030)
pH: 5 (ref 5.0–8.0)

## 2017-09-09 LAB — COMPREHENSIVE METABOLIC PANEL
ALT: 18 U/L (ref 0–44)
AST: 15 U/L (ref 15–41)
Albumin: 3.5 g/dL (ref 3.5–5.0)
Alkaline Phosphatase: 113 U/L (ref 38–126)
Anion gap: 9 (ref 5–15)
BUN: 17 mg/dL (ref 6–20)
CO2: 29 mmol/L (ref 22–32)
Calcium: 9.2 mg/dL (ref 8.9–10.3)
Chloride: 99 mmol/L (ref 98–111)
Creatinine, Ser: 0.66 mg/dL (ref 0.44–1.00)
GFR calc Af Amer: >60 mL/min (ref >60)
GFR calc non Af Amer: >60 mL/min (ref >60)
Glucose, Bld: 239 mg/dL — ABNORMAL HIGH (ref 70–99)
Potassium: 4.3 mmol/L (ref 3.5–5.1)
Sodium: 137 mmol/L (ref 135–145)
Total Bilirubin: 0.6 mg/dL (ref 0.3–1.2)
Total Protein: 7.4 g/dL (ref 6.5–8.1)

## 2017-09-09 LAB — CBC
HCT: 38.5 % (ref 36.0–46.0)
HEMOGLOBIN: 12.8 g/dL (ref 12.0–15.0)
MCH: 27.5 pg (ref 26.0–34.0)
MCHC: 33.2 g/dL (ref 30.0–36.0)
MCV: 82.8 fL (ref 78.0–100.0)
PLATELETS: 343 10*3/uL (ref 150–400)
RBC: 4.65 MIL/uL (ref 3.87–5.11)
RDW: 12.7 % (ref 11.5–15.5)
WBC: 10.7 10*3/uL — AB (ref 4.0–10.5)

## 2017-09-09 LAB — I-STAT TROPONIN, ED: TROPONIN I, POC: 0.01 ng/mL (ref 0.00–0.08)

## 2017-09-09 MED ORDER — SULFAMETHOXAZOLE-TRIMETHOPRIM 800-160 MG PO TABS: 1.0000 | ORAL_TABLET | Freq: Two times a day (BID) | ORAL | 0 refills | Status: AC

## 2017-09-09 MED ORDER — FAMOTIDINE 20 MG PO TABS
20.0000 mg | ORAL_TABLET | Freq: Once | ORAL | Status: AC
  Administered 2017-09-09: 20 mg via ORAL
  Filled 2017-09-09: qty 1

## 2017-09-09 MED ORDER — ACETAMINOPHEN 325 MG PO TABS
650.0000 mg | ORAL_TABLET | Freq: Once | ORAL | Status: AC
  Administered 2017-09-09: 650 mg via ORAL
  Filled 2017-09-09: qty 2

## 2017-09-09 MED ORDER — FAMOTIDINE 20 MG PO TABS
20.0000 mg | ORAL_TABLET | Freq: Two times a day (BID) | ORAL | 0 refills | Status: AC
Start: 2017-09-09 — End: ?

## 2017-09-09 NOTE — ED Triage Notes (Signed)
Pt to ed by POV with c/o of headache, weak bladder, and cough. Pt states that these are side effects from taking lisinopril per patient Doctor. Pt doctor wanted patient to be seen here for these symptoms and stop the medication. Pt has been taking Lisinopril for two years now, but recently increased the medication from 20 to 40 mg. Pt is A&O x 4. Pt had hx of DM and states she hasnt been able to eat because of the cough.

## 2017-09-09 NOTE — Discharge Instructions (Addendum)
You were evaluated in the Emergency Department and after careful evaluation, we did not find any emergent condition requiring admission or further testing in the hospital.  Your symptoms today seem to be due to a cough, which may be a side effect of your blood pressure medication or a sign of acid reflux.  Please take the medications provided as directed.  It appears that you have a urinary tract infection today, which should improve with the antibiotics provided.  Follow-up with your primary care provider to discuss your blood pressure control.  Please return to the Emergency Department if you experience any worsening of your condition.  We encourage you to follow up with a primary care provider.  Thank you for allowing [place] to be a part of your care.

## 2017-09-09 NOTE — ED Provider Notes (Signed)
[facility] Emergency Department Provider Note MRN:  [id number]  Arrival date & time: 09/09/17     Chief Complaint   Cough; Headache; and Medication Reaction   History of Present Illness   Melanie Luna is a 42 y.o. year-old female with a history of hypertension, diabetes presenting to the ED with chief complaint of cough.  Patient explains that she takes lisinopril for high blood pressure, her dose was increased from 20 mg to 40 mg a few weeks ago.  Since that time she is noticed persistent dry cough.  No recent fevers.  Cough is been causing her posttussive emesis, trouble eating or drinking due to the cough.  Patient tried stopping the lisinopril for a few days with improvement of the cough.  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  Endorsing some sharp left-sided chest pain due to the cough.  No swelling of the face or lips, no shortness of breath, no abdominal pain, no dysuria.  Review of Systems  A complete 10 system review of systems was obtained and all systems are negative except as noted in the HPI and PMH.   Patient's Health History    Past Medical History:  Diagnosis Date  . Complication of anesthesia    slow to wake up  . Diabetes mellitus    takes insulin - type 2  . Fibroid   . Hypertension    takes meds  . Infection    UTI  . Insomnia   . SVD (spontaneous vaginal delivery)    x 2    Past Surgical History:  Procedure Laterality Date  . CESAREAN SECTION  2003   x 1  . DILITATION & CURRETTAGE/HYSTROSCOPY WITH HYDROTHERMAL ABLATION N/A 11/11/2016   Procedure: DILATATION & CURETTAGE/HYSTEROSCOPY WITH HYDROTHERMAL ABLATION;  Surgeon: Emily Filbert, MD;  Location: [facility];  Service: Gynecology;  Laterality: N/A;  . ENDOMETRIAL ABLATION  2003  . HYSTEROSCOPY W/D&C N/A 06/22/2016   Procedure: DILATATION AND CURETTAGE /HYSTEROSCOPY;  Surgeon: Emily Filbert, MD;  Location: [facility];  Service:  Gynecology;  Laterality: N/A;  . TUBAL LIGATION  2003    Family History  Problem Relation Age of Onset  . Gout Other   . Diabetes Father   . Heart disease Father   . Diabetes Maternal Grandmother   . Diabetes Maternal Grandfather   . Diabetes Paternal Grandmother   . Diabetes Paternal Grandfather     Social History   Socioeconomic History  . Marital status: Single    Spouse name: Not on file  . Number of children: Not on file  . Years of education: Not on file  . Highest education level: Not on file  Occupational History  . Not on file  Social Needs  . Financial resource strain: Not on file  . Food insecurity:    Worry: Not on file    Inability: Not on file  . Transportation needs:    Medical: Not on file    Non-medical: Not on file  Tobacco Use  . Smoking status: Never Smoker  . Smokeless tobacco: Never Used  Substance and Sexual Activity  . Alcohol use: No  . Drug use: No  . Sexual activity: Yes    Birth control/protection: Surgical  Lifestyle  . Physical activity:    Days per week: Not on file    Minutes per session: Not on file  . Stress: Not on file  Relationships  .  Social connections:    Talks on phone: Not on file    Gets together: Not on file    Attends religious service: Not on file    Active member of club or organization: Not on file    Attends meetings of clubs or organizations: Not on file    Relationship status: Not on file  . Intimate partner violence:    Fear of current or ex partner: Not on file    Emotionally abused: Not on file    Physically abused: Not on file    Forced sexual activity: Not on file  Other Topics Concern  . Not on file  Social History Narrative  . Not on file     Physical Exam  Vital Signs and Nursing Notes reviewed Vitals:   09/09/17 1514  BP: (!) 138/94  Pulse: 99  Resp: 18  Temp: 98.4 F (36.9 C)  SpO2: 98%    CONSTITUTIONAL: Well-appearing, NAD NEURO:  Alert and oriented x 3, no focal deficits EYES:   eyes equal and reactive ENT/NECK:  no LAD, no JVD CARDIO: Regular rate, well-perfused, normal S1 and S2 PULM:  CTAB no wheezing or rhonchi GI/GU:  normal bowel sounds, non-distended, non-tender MSK/SPINE:  No gross deformities, no edema SKIN:  no rash, atraumatic PSYCH:  Appropriate speech and behavior  Diagnostic and Interventional Summary    EKG Interpretation  Date/Time:  Friday September 09 2017 16:41:40 EDT Ventricular Rate:  87 PR Interval:    QRS Duration: 74 QT Interval:  383 QTC Calculation: 461 R Axis:   24 Text Interpretation:  Sinus rhythm Confirmed by Gerlene Fee [id number]) on 09/09/2017 4:46:34 PM      Labs Reviewed  CBC - Abnormal; Notable for the following components:      Result Value   WBC 10.7 (*)    All other components within normal limits  COMPREHENSIVE METABOLIC PANEL - Abnormal; Notable for the following components:   Glucose, Bld 239 (*)    All other components within normal limits  URINALYSIS, ROUTINE W REFLEX MICROSCOPIC - Abnormal; Notable for the following components:   APPearance HAZY (*)    Glucose, UA >=500 (*)    Ketones, ur 5 (*)    Nitrite POSITIVE (*)    Leukocytes, UA TRACE (*)    Bacteria, UA MANY (*)    All other components within normal limits  PREGNANCY, URINE  I-STAT TROPONIN, ED    No orders to display    Medications  acetaminophen (TYLENOL) tablet 650 mg (650 mg Oral Given 09/09/17 1702)  famotidine (PEPCID) tablet 20 mg (20 mg Oral Given 09/09/17 1702)     Procedures Critical Care  ED Course and Medical Decision Making  I have reviewed the triage vital signs and the nursing notes.  Pertinent labs & imaging results that were available during my care of the patient were reviewed by me and considered in my medical decision making (see below for details).    42 year old female history of diabetes, very well-appearing, appears to have cough due to lisinopril related to bradykinin or cough related to GERD.  Chest pain is  atypical in favor to be due to persistent cough, will obtain screening labs and encourage PCP follow-up.  Work-up reveals UTI, patient asymptomatic but her glucose has been labile at home.  We will treat with Bactrim given penicillin allergy.  Advised to follow-up with her PCP to discuss blood pressure control, will hold lisinopril until that time.  Barth Kirks. Sedonia Small, [place]  Emergency [place] [email or web address]  Final Clinical Impressions(s) / ED Diagnoses     ICD-10-CM   1. Cough R05   2. Urinary tract infection without hematuria, site unspecified N39.0     ED Discharge Orders         Ordered    sulfamethoxazole-trimethoprim (BACTRIM DS,SEPTRA DS) 800-160 MG tablet  2 times daily     09/09/17 1715    famotidine (PEPCID) 20 MG tablet  2 times daily     09/09/17 1715             Maudie Flakes, MD 09/09/17 1719

## 2017-12-02 ENCOUNTER — Ambulatory Visit: Payer: Medicaid Other | Admitting: Podiatry

## 2017-12-02 ENCOUNTER — Encounter: Payer: Self-pay | Admitting: Podiatry

## 2017-12-02 VITALS — BP 150/101 | HR 89

## 2017-12-02 DIAGNOSIS — B351 Tinea unguium: Secondary | ICD-10-CM | POA: Diagnosis not present

## 2017-12-02 DIAGNOSIS — B353 Tinea pedis: Secondary | ICD-10-CM

## 2017-12-02 MED ORDER — CLOTRIMAZOLE 1 % EX CREA: TOPICAL_CREAM | CUTANEOUS | 1 refills | Status: DC

## 2017-12-02 MED ORDER — CICLOPIROX 8 % EX SOLN: Freq: Every day | CUTANEOUS | 11 refills | Status: AC

## 2017-12-02 NOTE — Patient Instructions (Addendum)
Athlete's Foot Athlete's foot (tinea pedis) is a fungal infection of the skin on the feet. It often occurs on the skin that is between or underneath the toes. It can also occur on the soles of the feet. The infection can spread from person to person (is contagious). What are the causes? Athlete's foot is caused by a fungus. This fungus grows in warm, moist places. Most people get athlete's foot by sharing shower stalls, towels, and wet floors with someone who is infected. Not washing your feet or changing your socks often enough can contribute to athlete's foot. What increases the risk? This condition is more likely to develop in:  Men.  People who have a weak body defense system (immune system).  People who have diabetes.  People who use public showers, such as at a gym.  People who wear heavy-duty shoes, such as Environmental manager.  Seasons with warm, humid weather.  What are the signs or symptoms? Symptoms of this condition include:  Itchy areas between the toes or on the soles of the feet.  White, flaky, or scaly areas between the toes or on the soles of the feet.  Very itchy small blisters between the toes or on the soles of the feet.  Small cuts on the skin. These cuts can become infected.  Thick or discolored toenails.  How is this diagnosed? This condition is diagnosed with a medical history and physical exam. Your health care provider may also take a skin or toenail sample to be examined. How is this treated? Treatment for this condition includes antifungal medicines. These may be applied as powders, ointments, or creams. In severe cases, an oral antifungal medicine may be given. Follow these instructions at home:  Apply or take over-the-counter and prescription medicines only as told by your health care provider.  Keep all follow-up visits as told by your health care provider. This is important.  Do not scratch your feet.  Keep your feet dry: ? Wear  cotton or wool socks. Change your socks every day or if they become wet. ? Wear shoes that allow air to circulate, such as sandals or canvas tennis shoes.  Wash and dry your feet: ? Every day or as told by your health care provider. ? After exercising. ? Including the area between your toes.  Do not share towels, nail clippers, or other personal items that touch your feet with others.  If you have diabetes, keep your blood sugar under control. How is this prevented?  Do not share towels.  Wear sandals in wet areas, such as locker rooms and shared showers.  Keep your feet dry: ? Wear cotton or wool socks. Change your socks every day or if they become wet. ? Wear shoes that allow air to circulate, such as sandals or canvas tennis shoes.  Wash and dry your feet after exercising. Pay attention to the area between your toes. Contact a health care provider if:  You have a fever.  You have swelling, soreness, warmth, or redness in your foot.  You are not getting better with treatment.  Your symptoms get worse.  You have new symptoms. This information is not intended to replace advice given to you by your health care provider. Make sure you discuss any questions you have with your health care provider. Document Released: 01/09/2000 Document Revised: 06/19/2015 Document Reviewed: 07/15/2014 Elsevier Interactive Patient Education  2018 [employer]  WHAT IS IT? An infection that lies within the keratin of  your nail plate that is caused by a fungus.  WHY ME? Fungal infections affect all ages, sexes, races, and creeds.  There may be many factors that predispose you to a fungal infection such as age, coexisting medical conditions such as diabetes, or an autoimmune disease; stress, medications, fatigue, genetics, etc.  Bottom line: fungus thrives in a warm, moist environment and your shoes offer such a location.  IS IT CONTAGIOUS? Theoretically, yes.  You  do not want to share shoes, nail clippers or files with someone who has fungal toenails.  Walking around barefoot in the same room or sleeping in the same bed is unlikely to transfer the organism.  It is important to realize, however, that fungus can spread easily from one nail to the next on the same foot.  HOW DO WE TREAT THIS?  There are several ways to treat this condition.  Treatment may depend on many factors such as age, medications, pregnancy, liver and kidney conditions, etc.  It is best to ask your doctor which options are available to you.  1. No treatment.   Unlike many other medical concerns, you can live with this condition.  However for many people this can be a painful condition and may lead to ingrown toenails or a bacterial infection.  It is recommended that you keep the nails cut short to help reduce the amount of fungal nail. 2. Topical treatment.  These range from herbal remedies to prescription strength nail lacquers.  About 40-50% effective, topicals require twice daily application for approximately 9 to 12 months or until an entirely new nail has grown out.  The most effective topicals are medical grade medications available through physicians offices. 3. Oral antifungal medications.  With an 80-90% cure rate, the most common oral medication requires 3 to 4 months of therapy and stays in your system for a year as the new nail grows out.  Oral antifungal medications do require blood work to make sure it is a safe drug for you.  A liver function panel will be performed prior to starting the medication and after the first month of treatment.  It is important to have the blood work performed to avoid any harmful side effects.  In general, this medication safe but blood work is required. 4. Laser Therapy.  This treatment is performed by applying a specialized laser to the affected nail plate.  This therapy is noninvasive, fast, and non-painful.  It is not covered by insurance and is  therefore, out of pocket.  The results have been very good with a 80-95% cure rate.  [facility] is the only practice in the area to offer this therapy. 5. Permanent Nail Avulsion.  Removing the entire nail so that a new nail will not grow back.  Diabetes and Foot Care Diabetes may cause you to have problems because of poor blood supply (circulation) to your feet and legs. This may cause the skin on your feet to become thinner, break easier, and heal more slowly. Your skin may become dry, and the skin may peel and crack. You may also have nerve damage in your legs and feet causing decreased feeling in them. You may not notice minor injuries to your feet that could lead to infections or more serious problems. Taking care of your feet is one of the most important things you can do for yourself. Follow these instructions at home:  Wear shoes at all times, even in the house. Do not go barefoot. Bare  feet are easily injured.  Check your feet daily for blisters, cuts, and redness. If you cannot see the bottom of your feet, use a mirror or ask someone for help.  Wash your feet with warm water (do not use hot water) and mild soap. Then pat your feet and the areas between your toes until they are completely dry. Do not soak your feet as this can dry your skin.  Apply a moisturizing lotion or petroleum jelly (that does not contain alcohol and is unscented) to the skin on your feet and to dry, brittle toenails. Do not apply lotion between your toes.  Trim your toenails straight across. Do not dig under them or around the cuticle. File the edges of your nails with an emery board or nail file.  Do not cut corns or calluses or try to remove them with medicine.  Wear clean socks or stockings every day. Make sure they are not too tight. Do not wear knee-high stockings since they may decrease blood flow to your legs.  Wear shoes that fit properly and have enough cushioning. To break in new shoes, wear  them for just a few hours a day. This prevents you from injuring your feet. Always look in your shoes before you put them on to be sure there are no objects inside.  Do not cross your legs. This may decrease the blood flow to your feet.  If you find a minor scrape, cut, or break in the skin on your feet, keep it and the skin around it clean and dry. These areas may be cleansed with mild soap and water. Do not cleanse the area with peroxide, alcohol, or iodine.  When you remove an adhesive bandage, be sure not to damage the skin around it.  If you have a wound, look at it several times a day to make sure it is healing.  Do not use heating pads or hot water bottles. They may burn your skin. If you have lost feeling in your feet or legs, you may not know it is happening until it is too late.  Make sure your health care provider performs a complete foot exam at least annually or more often if you have foot problems. Report any cuts, sores, or bruises to your health care provider immediately. Contact a health care provider if:  You have an injury that is not healing.  You have cuts or breaks in the skin.  You have an ingrown nail.  You notice redness on your legs or feet.  You feel burning or tingling in your legs or feet.  You have pain or cramps in your legs and feet.  Your legs or feet are numb.  Your feet always feel cold. Get help right away if:  There is increasing redness, swelling, or pain in or around a wound.  There is a red line that goes up your leg.  Pus is coming from a wound.  You develop a fever or as directed by your health care provider.  You notice a bad smell coming from an ulcer or wound. This information is not intended to replace advice given to you by your health care provider. Make sure you discuss any questions you have with your health care provider. Document Released: 01/09/2000 Document Revised: 06/19/2015 Document Reviewed: 06/20/2012 Elsevier  Interactive Patient Education  2017 [employer].

## 2017-12-19 ENCOUNTER — Encounter: Payer: Self-pay | Admitting: Podiatry

## 2017-12-19 NOTE — Progress Notes (Signed)
Subjective:"I go to the salon and I noticed my left great toe's free edge was turning white. The technician convinced me to soak using that blue pill." Melanie Luna presents today with cc of discolored toenail let great toe which she noticed changing colors recently.   She gets pedicures frequently. She also notices skin peeling on both of her feet. She denies any blistering, weeping or open wounds.  Past Medical History:  Diagnosis Date  . Complication of anesthesia    slow to wake up  . Diabetes mellitus    takes insulin - type 2  . Fibroid   . Hypertension    takes meds  . Infection    UTI  . Insomnia   . SVD (spontaneous vaginal delivery)    x 2   Patient Active Problem List   Diagnosis Date Noted  . Abnormal uterine bleeding (AUB) 08/10/2016   Past Surgical History:  Procedure Laterality Date  . CESAREAN SECTION  2003   x 1  . DILITATION & CURRETTAGE/HYSTROSCOPY WITH HYDROTHERMAL ABLATION N/A 11/11/2016   Procedure: DILATATION & CURETTAGE/HYSTEROSCOPY WITH HYDROTHERMAL ABLATION;  Surgeon: Emily Filbert, MD;  Location: [facility];  Service: Gynecology;  Laterality: N/A;  . ENDOMETRIAL ABLATION  2003  . HYSTEROSCOPY W/D&C N/A 06/22/2016   Procedure: DILATATION AND CURETTAGE /HYSTEROSCOPY;  Surgeon: Emily Filbert, MD;  Location: [facility];  Service: Gynecology;  Laterality: N/A;  . TUBAL LIGATION  2003   Allergies  Allergen Reactions  . Codeine Other (See Comments)    Reaction:  Seizures . Pt states she has tolerated Percocet in the past  . Penicillins Other (See Comments)    Reaction:  Seizures, convulsions Has patient had a PCN reaction causing immediate rash, facial/tongue/throat swelling, SOB or lightheadedness with hypotension: Yes Has patient had a PCN reaction causing severe rash involving mucus membranes or skin necrosis: No Has patient had a PCN reaction that required hospitalization: Yes Has patient had a PCN reaction occurring within the last 10 years: No If all of  the above answers are "NO", then may proceed with Cephalosporin use.    Social History   Occupational History  . Not on file  Tobacco Use  . Smoking status: Never Smoker  . Smokeless tobacco: Never Used  Substance and Sexual Activity  . Alcohol use: No  . Drug use: No  . Sexual activity: Yes    Birth control/protection: Surgical   Family History  Problem Relation Age of Onset  . Gout Other   . Diabetes Father   . Heart disease Father   . Diabetes Maternal Grandmother   . Diabetes Maternal Grandfather   . Diabetes Paternal Grandmother   . Diabetes Paternal Grandfather    Objective: Vitals:   12/02/17 1248  BP: (!) 150/101  Pulse: 89   Vascular Examination: Capillary refill time immediate x 10 digits Dorsalis pedis and Posterior tibial pulses palpable b/l Digital hair x 10 digits present Skin temperature gradient WNL b/l  Dermatological Examination: Skin with normal turgor and tone b/l  Bilateral hallux, bilateral 5th digits toenails discolored, thick, dystrophic with subungual debris and pain with palpation to nailbed due to thickness of nail.  Diffuse scaling noted peripherally and plantarly b/l feet with mild foot odor.  No interdigital macerations.  No blisters, no weeping. No signs of secondary bacterial infection noted.  Musculoskeletal: Muscle strength 5/5 to all LE muscle groups  Neurological: Sensation intact with 10 gram monofilament. Vibratory sensation intact.  Assessment: Painful onychomycosis toenails b/l  hallux, b/l 5th digits Tinea pedis  Plan: 1. Discussed onychomycosis and tinea pedis and treatment options. She declines oral antifungal therapy on today. Literature dispensed. Rx for Clotrimazole Cream 1% to be applied to both feet and between toes bid x 4 weeks. Rx written for Penlac Nail Lacquer 8% to be applied to affected toenails once daily for 48 weeks and removed once weekly with nail polis remover. 2. Toenails 1-5 b/l were debrided in  length and girth without iatrogenic bleeding. 3. Patient to continue soft, supportive shoe gear 4. Patient to report any pedal injuries to medical professional immediately. 5. Follow up 3 months. Patient/POA to call should there be a concern in the interim.

## 2018-01-19 ENCOUNTER — Emergency Department (HOSPITAL_COMMUNITY)
Admission: EM | Admit: 2018-01-19 | Discharge: 2018-01-19 | Disposition: A | Discharge status: Home / Self Care | Payer: Medicaid Other | Attending: Emergency Medicine | Admitting: Emergency Medicine

## 2018-01-19 ENCOUNTER — Encounter (HOSPITAL_COMMUNITY): Payer: Self-pay | Admitting: Emergency Medicine

## 2018-01-19 DIAGNOSIS — I10 Essential (primary) hypertension: Secondary | ICD-10-CM | POA: Diagnosis not present

## 2018-01-19 DIAGNOSIS — R197 Diarrhea, unspecified: Secondary | ICD-10-CM | POA: Insufficient documentation

## 2018-01-19 DIAGNOSIS — E119 Type 2 diabetes mellitus without complications: Secondary | ICD-10-CM | POA: Diagnosis not present

## 2018-01-19 DIAGNOSIS — R112 Nausea with vomiting, unspecified: Secondary | ICD-10-CM | POA: Diagnosis present

## 2018-01-19 DIAGNOSIS — Z794 Long term (current) use of insulin: Secondary | ICD-10-CM | POA: Diagnosis not present

## 2018-01-19 LAB — I-STAT CHEM 8, ED
BUN: 21 mg/dL — ABNORMAL HIGH (ref 6–20)
CALCIUM ION: 1.08 mmol/L — AB (ref 1.15–1.40)
CREATININE: 0.6 mg/dL (ref 0.44–1.00)
Chloride: 105 mmol/L (ref 98–111)
GLUCOSE: 154 mg/dL — AB (ref 70–99)
HCT: 38 % (ref 36.0–46.0)
HEMOGLOBIN: 12.9 g/dL (ref 12.0–15.0)
Potassium: 4.1 mmol/L (ref 3.5–5.1)
Sodium: 138 mmol/L (ref 135–145)
TCO2: 26 mmol/L (ref 22–32)

## 2018-01-19 MED ORDER — ONDANSETRON HCL 4 MG/2ML IJ SOLN
4.0000 mg | Freq: Once | INTRAMUSCULAR | Status: AC
  Administered 2018-01-19: 4 mg via INTRAVENOUS
  Filled 2018-01-19: qty 2

## 2018-01-19 MED ORDER — LACTATED RINGERS IV BOLUS
1000.0000 mL | Freq: Once | INTRAVENOUS | Status: AC
  Administered 2018-01-19: 1000 mL via INTRAVENOUS

## 2018-01-19 MED ORDER — ONDANSETRON HCL 4 MG PO TABS: 4.0000 mg | ORAL_TABLET | Freq: Three times a day (TID) | ORAL | 0 refills | Status: AC | PRN

## 2018-01-19 NOTE — ED Triage Notes (Signed)
Patient here from home with complaints of vomiting and diarrhea that started at 3am this morning. Denies abd pain. 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

## 2018-01-19 NOTE — ED Provider Notes (Signed)
Emergency Department Provider Note   I have reviewed the triage vital signs and the nursing notes.   HISTORY  Chief Complaint Nausea; Emesis; and Diarrhea   HPI Melanie Luna is a 42 y.o. 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  Patient started around 3:00 this morning she started having multiple episodes of nonbloody nonbilious vomiting associated with nonbloody diarrhea that has turned watery at this point.  She is no longer having any vomiting but she still feels nauseous.  She is felt hot subjectively but did not check her temperature.  No new rashes.  She states multiple people at her facility have been sick recently and she has worked multiple double shifts that are unanticipated. No other associated or modifying symptoms.    Past Medical History:  Diagnosis Date  . Complication of anesthesia    slow to wake up  . Diabetes mellitus    takes insulin - type 2  . Fibroid   . Hypertension    takes meds  . Infection    UTI  . Insomnia   . SVD (spontaneous vaginal delivery)    x 2    Patient Active Problem List   Diagnosis Date Noted  . Abnormal uterine bleeding (AUB) 08/10/2016    Past Surgical History:  Procedure Laterality Date  . CESAREAN SECTION  2003   x 1  . DILITATION & CURRETTAGE/HYSTROSCOPY WITH HYDROTHERMAL ABLATION N/A 11/11/2016   Procedure: DILATATION & CURETTAGE/HYSTEROSCOPY WITH HYDROTHERMAL ABLATION;  Surgeon: Emily Filbert, MD;  Location: [facility];  Service: Gynecology;  Laterality: N/A;  . ENDOMETRIAL ABLATION  2003  . HYSTEROSCOPY W/D&C N/A 06/22/2016   Procedure: DILATATION AND CURETTAGE /HYSTEROSCOPY;  Surgeon: Emily Filbert, MD;  Location: [facility];  Service: Gynecology;  Laterality: N/A;  . TUBAL LIGATION  2003    Current Outpatient Rx  . Order #: [id number] Class: Historical Med  . Order #: [id number] Class: Normal  . Order #: [id number] Class: Normal  . Order #: [id number] Class:  Historical Med  . Order #: [id number] Class: Historical Med  . Order #: [id number] Class: Print  . Order #: [id number] Class: Print  . Order #: [id number] Class: Historical Med  . Order #: [id number] Class: Historical Med  . Order #: [id number] Class: Print  . Order #: [id number] Class: Historical Med  . Order #: [id number] Class: Normal    Allergies Codeine and Penicillins  Family History  Problem Relation Age of Onset  . Gout Other   . Diabetes Father   . Heart disease Father   . Diabetes Maternal Grandmother   . Diabetes Maternal Grandfather   . Diabetes Paternal Grandmother   . Diabetes Paternal Grandfather     Social History Social History   Tobacco Use  . Smoking status: Never Smoker  . Smokeless tobacco: Never Used  Substance Use Topics  . Alcohol use: No  . Drug use: No    Review of Systems  All other systems negative except as documented in the HPI. All pertinent positives and negatives as reviewed in the HPI. ____________________________________________   PHYSICAL EXAM:  VITAL SIGNS: ED Triage Vitals  Enc Vitals Group     BP 01/19/18 0806 124/79     Pulse Rate 01/19/18 0806 (!) 106     Resp 01/19/18 0806 18     Temp 01/19/18 0806 98 F (36.7 C)     Temp Source 01/19/18 0806 Oral     SpO2 01/19/18 0806 100 %    Constitutional:  Alert and oriented. Well appearing and in no acute distress. Eyes: Conjunctivae are normal. PERRL. EOMI. Head: Atraumatic. Nose: No congestion/rhinnorhea. Mouth/Throat: Mucous membranes are dry.  Oropharynx non-erythematous. Neck: No stridor.  No meningeal signs.   Cardiovascular: tachycardic rate, regular rhythm. Good peripheral circulation. Grossly normal heart sounds.   Respiratory: Normal respiratory effort.  No retractions. Lungs CTAB. Gastrointestinal: Soft and nontender. No distention.  Musculoskeletal: No lower extremity tenderness nor edema. No gross deformities of extremities. Neurologic:  Normal speech and language. No gross  focal neurologic deficits are appreciated.  Skin:  Skin is warm, dry and intact. No rash noted.  ____________________________________________   LABS (all labs ordered are listed, but only abnormal results are displayed)  Labs Reviewed  I-STAT CHEM 8, ED - Abnormal; Notable for the following components:      Result Value   BUN 21 (*)    Glucose, Bld 154 (*)    Calcium, Ion 1.08 (*)    All other components within normal limits   ____________________________________________   INITIAL IMPRESSION / ASSESSMENT AND PLAN / ED COURSE  Patient here with GI illness.  Appears dehydrated on exam.  Will treat for same.  Symptomatic treatment otherwise.  Vomiting improved. No significant dehydration. Tolerating PO. Repeat abdomen exam benign. Suspect likely viral Gi illness. Less suspicion for appendicitis, UTI, pyelo, obstruction, colitis or other serious causes. Plan for symptomatic treatment at home with strict return precautions or PCP follow up if not improvng.   Pertinent labs & imaging results that were available during my care of the patient were reviewed by me and considered in my medical decision making (see chart for details).  ____________________________________________  FINAL CLINICAL IMPRESSION(S) / ED DIAGNOSES  Final diagnoses:  Non-intractable vomiting with nausea, unspecified vomiting type  Diarrhea, unspecified type     MEDICATIONS GIVEN DURING THIS VISIT:  Medications  ondansetron (ZOFRAN) injection 4 mg (4 mg Intravenous Given 01/19/18 0909)  lactated ringers bolus 1,000 mL (1,000 mLs Intravenous New Bag/Given 01/19/18 0908)     NEW OUTPATIENT MEDICATIONS STARTED DURING THIS VISIT:  New Prescriptions   ONDANSETRON (ZOFRAN) 4 MG TABLET    Take 1 tablet (4 mg total) by mouth every 8 (eight) hours as needed for nausea or vomiting.    Note:  This note was prepared with assistance of Dragon voice recognition software. Occasional wrong-word or sound-a-like  substitutions may have occurred due to the inherent limitations of voice recognition software.   Merrily Pew, MD 01/19/18 [phone]

## 2018-03-03 ENCOUNTER — Ambulatory Visit: Payer: Medicaid Other | Admitting: Podiatry

## 2018-04-17 ENCOUNTER — Ambulatory Visit: Payer: Medicaid Other | Admitting: Podiatry

## 2018-06-02 ENCOUNTER — Other Ambulatory Visit: Payer: Self-pay

## 2018-06-02 ENCOUNTER — Encounter: Payer: Self-pay | Admitting: Podiatry

## 2018-06-02 ENCOUNTER — Ambulatory Visit (INDEPENDENT_AMBULATORY_CARE_PROVIDER_SITE_OTHER): Payer: Medicaid Other | Admitting: Podiatry

## 2018-06-02 VITALS — Temp 97.3°F

## 2018-06-02 DIAGNOSIS — B353 Tinea pedis: Secondary | ICD-10-CM

## 2018-06-02 DIAGNOSIS — B351 Tinea unguium: Secondary | ICD-10-CM | POA: Diagnosis not present

## 2018-06-02 MED ORDER — CLOTRIMAZOLE 1 % EX CREA: TOPICAL_CREAM | CUTANEOUS | 1 refills | Status: AC

## 2018-06-02 NOTE — Patient Instructions (Signed)
Athlete's Foot  Athlete's foot (tinea pedis) is a fungal infection of the skin on your feet. It often occurs on the skin that is between or underneath the toes. It can also occur on the soles of your feet. The infection can spread from person to person (is contagious). It can also spread when a person's bare feet come in contact with the fungus on shower floors or on items such as shoes. What are the causes? This condition is caused by a fungus that grows in warm, moist places. You can get athlete's foot by sharing shoes, shower stalls, towels, and wet floors with someone who is infected. Not washing your feet or changing your socks often enough can also lead to athlete's foot. What increases the risk? This condition is more likely to develop in:  Men.  People who have a weak body defense system (immune system).  People who have diabetes.  People who use public showers, such as at a gym.  People who wear heavy-duty shoes, such as industrial or military shoes.  Seasons with warm, humid weather. What are the signs or symptoms? Symptoms of this condition include:  Itchy areas between your toes or on the soles of your feet.  White, flaky, or scaly areas between your toes or on the soles of your feet.  Very itchy small blisters between your toes or on the soles of your feet.  Small cuts in your skin. These cuts can become infected.  Thick or discolored toenails. How is this diagnosed? This condition may be diagnosed with a physical exam and a review of your medical history. Your health care provider may also take a skin or toenail sample to examine under a microscope. How is this treated? This condition is treated with antifungal medicines. These may be applied as powders, ointments, or creams. In severe cases, an oral antifungal medicine may be given. Follow these instructions at home: Medicines  Apply or take over-the-counter and prescription medicines only as told by your health  care provider.  Apply your antifungal medicine as told by your health care provider. Do not stop using the antifungal even if your condition improves. Foot care  Do not scratch your feet.  Keep your feet dry: ? Wear cotton or wool socks. Change your socks every day or if they become wet. ? Wear shoes that allow air to flow, such as sandals or canvas tennis shoes.  Wash and dry your feet, including the area between your toes. Also, wash and dry your feet: ? Every day or as told by your health care provider. ? After exercising. General instructions  Do not let others use towels, shoes, nail clippers, or other personal items that touch your feet.  Protect your feet by wearing sandals in wet areas, such as locker rooms and shared showers.  Keep all follow-up visits as told by your health care provider. This is important.  If you have diabetes, keep your blood sugar under control. Contact a health care provider if:  You have a fever.  You have swelling, soreness, warmth, or redness in your foot.  Your feet are not getting better with treatment.  Your symptoms get worse.  You have new symptoms. Summary  Athlete's foot (tinea pedis) is a fungal infection of the skin on your feet. It often occurs on skin that is between or underneath the toes.  This condition is caused by a fungus that grows in warm, moist places.  Symptoms include white, flaky, or scaly areas between   your toes or on the soles of your feet.  This condition is treated with antifungal medicines.  Keep your feet clean. Always dry them thoroughly. This information is not intended to replace advice given to you by your health care provider. Make sure you discuss any questions you have with your health care provider. Document Released: 01/09/2000 Document Revised: 11/01/2016 Document Reviewed: 11/01/2016 Elsevier Interactive Patient Education  2019 [employer] Onychomycosis/Fungal Toenails  WHAT IS IT? An  infection that lies within the keratin of your nail plate that is caused by a fungus.  WHY ME? Fungal infections affect all ages, sexes, races, and creeds.  There may be many factors that predispose you to a fungal infection such as age, coexisting medical conditions such as diabetes, or an autoimmune disease; stress, medications, fatigue, genetics, etc.  Bottom line: fungus thrives in a warm, moist environment and your shoes offer such a location.  IS IT CONTAGIOUS? Theoretically, yes.  You do not want to share shoes, nail clippers or files with someone who has fungal toenails.  Walking around barefoot in the same room or sleeping in the same bed is unlikely to transfer the organism.  It is important to realize, however, that fungus can spread easily from one nail to the next on the same foot.  HOW DO WE TREAT THIS?  There are several ways to treat this condition.  Treatment may depend on many factors such as age, medications, pregnancy, liver and kidney conditions, etc.  It is best to ask your doctor which options are available to you.  1. No treatment.   Unlike many other medical concerns, you can live with this condition.  However for many people this can be a painful condition and may lead to ingrown toenails or a bacterial infection.  It is recommended that you keep the nails cut short to help reduce the amount of fungal nail. 2. Topical treatment.  These range from herbal remedies to prescription strength nail lacquers.  About 40-50% effective, topicals require twice daily application for approximately 9 to 12 months or until an entirely new nail has grown out.  The most effective topicals are medical grade medications available through physicians offices. 3. Oral antifungal medications.  With an 80-90% cure rate, the most common oral medication requires 3 to 4 months of therapy and stays in your system for a year as the new nail grows out.  Oral antifungal medications do require blood work to make  sure it is a safe drug for you.  A liver function panel will be performed prior to starting the medication and after the first month of treatment.  It is important to have the blood work performed to avoid any harmful side effects.  In general, this medication safe but blood work is required. 4. Laser Therapy.  This treatment is performed by applying a specialized laser to the affected nail plate.  This therapy is noninvasive, fast, and non-painful.  It is not covered by insurance and is therefore, out of pocket.  The results have been very good with a 80-95% cure rate.  [facility] is the only practice in the area to offer this therapy. 5. Permanent Nail Avulsion.  Removing the entire nail so that a new nail will not grow back. 

## 2018-06-08 NOTE — Progress Notes (Signed)
Subjective: Melanie Luna presents  for follow up of mycotic toenail b/l hallux, b/l 5th digit nailplates.She states the nail plate has grown out. She has noticed some improvement, but is not pleased with appearance of left hallux toenail. She states she is using Penlac as instructed.  Pa, [facility] is her PCP.   Current Outpatient Medications:  .  amLODipine (NORVASC) 5 MG tablet, Take 5 mg by mouth daily., Disp: , Rfl: 5 .  ciclopirox (PENLAC) 8 % solution, Apply topically at bedtime. Apply over nail and surrounding skin. Apply daily over previous coat. Remove weekly with polish remover. (Patient taking differently: Apply 1 application topically at bedtime. Apply over nail and surrounding skin. (Feet)), Disp: 6.6 mL, Rfl: 11 .  clotrimazole (LOTRIMIN) 1 % cream, Apply to both feet and between toes twice daily, Disp: 30 g, Rfl: 1 .  diazepam (VALIUM) 5 MG tablet, TAKE 1 TABLET BY MOUTH 1 HOUR BEFORE FLYING AS NEEDED FOR ANXIETY., Disp: , Rfl:  .  diclofenac (VOLTAREN) 75 MG EC tablet, Take 75 mg by mouth 2 (two) times daily as needed for mild pain (muscle pain). , Disp: , Rfl: 1 .  docusate sodium (COLACE) 100 MG capsule, TAKE 2 CAPSULES BY MOUTH EVERY DAY AS NEEDED, Disp: , Rfl:  .  famotidine (PEPCID) 20 MG tablet, Take 1 tablet (20 mg total) by mouth 2 (two) times daily., Disp: 30 tablet, Rfl: 0 .  fluconazole (DIFLUCAN) 150 MG tablet, TAKE 1 TABLET BY MOUTH EVERY WEEK AS NEEDED., Disp: , Rfl:  .  ibuprofen (ADVIL,MOTRIN) 600 MG tablet, Take 600 mg by mouth every 6 (six) hours as needed for mild pain or headache., Disp: , Rfl: 1 .  insulin aspart (NOVOLOG FLEXPEN) 100 UNIT/ML FlexPen, Inject 15 Units into the skin 3 (three) times daily with meals., Disp: 15 mL, Rfl: 0 .  Insulin Degludec (TRESIBA FLEXTOUCH) 200 UNIT/ML SOPN, Inject 10 Units into the skin 2 (two) times daily. (Patient taking differently: Inject 50 Units into the skin at bedtime. ), Disp: 3 mL, Rfl: 0 .  lisinopril  (PRINIVIL,ZESTRIL) 40 MG tablet, Take 40 mg by mouth daily., Disp: , Rfl:  .  losartan (COZAAR) 100 MG tablet, Take 100 mg by mouth daily., Disp: , Rfl: 1 .  ondansetron (ZOFRAN) 4 MG tablet, Take 1 tablet (4 mg total) by mouth every 8 (eight) hours as needed for nausea or vomiting., Disp: 12 tablet, Rfl: 0 .  phentermine (ADIPEX-P) 37.5 MG tablet, Take 37.5 mg by mouth every morning., Disp: , Rfl: 2  Allergies  Allergen Reactions  . Codeine Other (See Comments)    Reaction:  Seizures . Pt states she has tolerated Percocet in the past  . Penicillins Other (See Comments)    Reaction:  Seizures, convulsions Has patient had a PCN reaction causing immediate rash, facial/tongue/throat swelling, SOB or lightheadedness with hypotension: Yes Has patient had a PCN reaction causing severe rash involving mucus membranes or skin necrosis: No Has patient had a PCN reaction that required hospitalization: Yes Has patient had a PCN reaction occurring within the last 10 years: No If all of the above answers are "NO", then may proceed with Cephalosporin use.     Objective: Vitals:   06/02/18 1009  Temp: (!) 97.3 F (36.3 C)   Vascular Examination: Capillary refill time immediate x 10 digits.  Dorsalis pedis and Posterior tibial pulses palpable b/l.  Digital hair present x 10 digits.  Skin temperature gradient WNL b/l.  Dermatological Examination:  Skin with normal turgor, texture and tone b/l  Toenails b/l hallux, b/l 5th digits with improvement in proximal 50% of nailplates. Distal 50% still discolored, thick, dystrophic with minimal subungual debris.  Diffuse scaling noted peripherally and plantarly b/l feet with mild foot odor.  No interdigital macerations.  No blisters, no weeping. No signs of secondary bacterial infection noted.  Musculoskeletal: Muscle strength 5/5 to all LE muscle groups.  No gross bony deformities b/l.  No pain, crepitus or joint limitation noted with ROM.    Neurological: Sensation intact with 10 gram monofilament.  Vibratory sensation intact.  Assessment: Onychomycosis toenails b/l great toes, b/l 5th digits with improvement  Plan: 1. Cleaned border left hallux with currette. 2. Patient to continue Penlac Nail Lacquer as instructed. 3. Refill Clotrimazole Cream for tinea pedis. 4. Patient to report any pedal injuries to medical professional immediately. 5. Follow up 6 months. At end of visit, patient stated she may not come back as she is not happy with the appearance of her left hallux. I explained to her topical treatment takes time to see results. Penlac is a 48 week treatment. I encouraged her to come back for follow up.  6. Patient/POA to call should there be a concern in the interim.

## 2018-10-04 ENCOUNTER — Telehealth: Payer: Self-pay | Admitting: Obstetrics & Gynecology

## 2018-10-04 DIAGNOSIS — Z1231 Encounter for screening mammogram for malignant neoplasm of breast: Secondary | ICD-10-CM

## 2018-10-04 NOTE — Telephone Encounter (Signed)
Called and spoke with patient, she is wanting to have a mammogram. She reports Dr. Rogelia Rohrer wants her to have one due her HTN, Diabetic, and on insulin. Pt has follow up appointment scheduled in the office in late October. Dr. Hulan Fray approved of ordering Mammogram. Order placed. Pt informed she will get a call for scheduling Mammogram.

## 2018-10-04 NOTE — Addendum Note (Signed)
Addended by: Donn Pierini on: 10/04/2018 04:17 PM   Modules accepted: Orders

## 2018-10-04 NOTE — Telephone Encounter (Signed)
The patient stated she would like to know how old do you have to be to get regular breast exams? She would like more information about them as well.

## 2018-11-23 ENCOUNTER — Ambulatory Visit
Admission: RE | Admit: 2018-11-23 | Discharge: 2018-11-23 | Disposition: A | Discharge status: Home / Self Care | Payer: Medicaid Other | Source: Ambulatory Visit | Attending: Obstetrics & Gynecology | Admitting: Obstetrics & Gynecology

## 2018-11-23 ENCOUNTER — Other Ambulatory Visit: Payer: Self-pay

## 2018-11-23 DIAGNOSIS — Z1231 Encounter for screening mammogram for malignant neoplasm of breast: Secondary | ICD-10-CM

## 2018-11-24 ENCOUNTER — Ambulatory Visit: Payer: Medicaid Other | Admitting: Obstetrics & Gynecology

## 2018-11-27 ENCOUNTER — Other Ambulatory Visit: Payer: Self-pay | Admitting: Obstetrics & Gynecology

## 2018-11-27 DIAGNOSIS — R928 Other abnormal and inconclusive findings on diagnostic imaging of breast: Secondary | ICD-10-CM

## 2018-11-30 ENCOUNTER — Ambulatory Visit
Admission: RE | Admit: 2018-11-30 | Discharge: 2018-11-30 | Disposition: A | Discharge status: Home / Self Care | Payer: Medicaid Other | Source: Ambulatory Visit | Attending: Obstetrics & Gynecology | Admitting: Obstetrics & Gynecology

## 2018-11-30 ENCOUNTER — Other Ambulatory Visit: Payer: Self-pay

## 2018-11-30 DIAGNOSIS — R928 Other abnormal and inconclusive findings on diagnostic imaging of breast: Secondary | ICD-10-CM

## 2018-12-25 ENCOUNTER — Other Ambulatory Visit: Payer: Self-pay

## 2018-12-25 ENCOUNTER — Emergency Department (HOSPITAL_COMMUNITY)
Admission: EM | Admit: 2018-12-25 | Discharge: 2018-12-26 | Disposition: A | Discharge status: Home / Self Care | Payer: Medicaid Other | Attending: Emergency Medicine | Admitting: Emergency Medicine

## 2018-12-25 ENCOUNTER — Encounter (HOSPITAL_COMMUNITY): Payer: Self-pay

## 2018-12-25 DIAGNOSIS — Z20828 Contact with and (suspected) exposure to other viral communicable diseases: Secondary | ICD-10-CM | POA: Insufficient documentation

## 2018-12-25 DIAGNOSIS — I10 Essential (primary) hypertension: Secondary | ICD-10-CM | POA: Diagnosis not present

## 2018-12-25 DIAGNOSIS — R509 Fever, unspecified: Secondary | ICD-10-CM | POA: Diagnosis present

## 2018-12-25 DIAGNOSIS — Z794 Long term (current) use of insulin: Secondary | ICD-10-CM | POA: Insufficient documentation

## 2018-12-25 DIAGNOSIS — M7918 Myalgia, other site: Secondary | ICD-10-CM | POA: Diagnosis not present

## 2018-12-25 DIAGNOSIS — R519 Headache, unspecified: Secondary | ICD-10-CM | POA: Insufficient documentation

## 2018-12-25 DIAGNOSIS — R5381 Other malaise: Secondary | ICD-10-CM | POA: Insufficient documentation

## 2018-12-25 DIAGNOSIS — E119 Type 2 diabetes mellitus without complications: Secondary | ICD-10-CM | POA: Insufficient documentation

## 2018-12-25 DIAGNOSIS — Z79899 Other long term (current) drug therapy: Secondary | ICD-10-CM | POA: Insufficient documentation

## 2018-12-25 MED ORDER — ACETAMINOPHEN 325 MG PO TABS
650.0000 mg | ORAL_TABLET | Freq: Once | ORAL | Status: AC | PRN
  Administered 2018-12-25: 650 mg via ORAL
  Filled 2018-12-25: qty 2

## 2018-12-25 NOTE — ED Triage Notes (Addendum)
Arrived by POV from home with c/o fever 103.0, body aches, and headache. Patient reports she has not been feeling well since Friday.Patient says "I think I have COVID". Denies cough, denies SHOB. Took Tylenol at 1400 today

## 2018-12-26 LAB — SARS CORONAVIRUS 2 (TAT 6-24 HRS): SARS Coronavirus 2: NEGATIVE

## 2018-12-26 MED ORDER — KETOROLAC TROMETHAMINE 60 MG/2ML IM SOLN
60.0000 mg | Freq: Once | INTRAMUSCULAR | Status: AC
  Administered 2018-12-26: 60 mg via INTRAMUSCULAR
  Filled 2018-12-26: qty 2

## 2018-12-26 NOTE — ED Notes (Signed)
Pt in with provider  

## 2018-12-26 NOTE — ED Provider Notes (Signed)
[facility] DEPT Provider Note   CSN: [id number] Arrival date & time: 12/25/18  2239     History   Chief Complaint Chief Complaint  Patient presents with  . Fever  . Headache    HPI Melanie Luna is a 43 y.o. female.     Patient presents to the emergency department for evaluation of fever and headache.  Patient reports that symptoms began 3 days ago.  She reports that she has had a poor appetite but has not had any nausea, vomiting, diarrhea or abdominal pain.  She denies urinary symptoms.  She has not had any cough, chest congestion or shortness of breath.  She reports generalized malaise and body aches associated with the fever.     Past Medical History:  Diagnosis Date  . Complication of anesthesia    slow to wake up  . Diabetes mellitus    takes insulin - type 2  . Fibroid   . Hypertension    takes meds  . Infection    UTI  . Insomnia   . SVD (spontaneous vaginal delivery)    x 2    Patient Active Problem List   Diagnosis Date Noted  . Abnormal uterine bleeding (AUB) 08/10/2016    Past Surgical History:  Procedure Laterality Date  . CESAREAN SECTION  2003   x 1  . DILITATION & CURRETTAGE/HYSTROSCOPY WITH HYDROTHERMAL ABLATION N/A 11/11/2016   Procedure: DILATATION & CURETTAGE/HYSTEROSCOPY WITH HYDROTHERMAL ABLATION;  Surgeon: Emily Filbert, MD;  Location: [facility];  Service: Gynecology;  Laterality: N/A;  . ENDOMETRIAL ABLATION  2003  . HYSTEROSCOPY W/D&C N/A 06/22/2016   Procedure: DILATATION AND CURETTAGE /HYSTEROSCOPY;  Surgeon: Emily Filbert, MD;  Location: [facility];  Service: Gynecology;  Laterality: N/A;  . TUBAL LIGATION  2003     OB History    Gravida  5   Para  3   Term  3   Preterm      AB  1   Living  3     SAB      TAB  1   Ectopic      Multiple      Live Births  3            Home Medications    Prior to Admission medications   Medication Sig Start Date End Date Taking?  Authorizing Provider  amLODipine (NORVASC) 5 MG tablet Take 5 mg by mouth daily. 08/22/17   [provider]  ciclopirox (PENLAC) 8 % solution Apply topically at bedtime. Apply over nail and surrounding skin. Apply daily over previous coat. Remove weekly with polish remover. Patient taking differently: Apply 1 application topically at bedtime. Apply over nail and surrounding skin. (Feet) 12/02/17   Marzetta Board, DPM  clotrimazole (LOTRIMIN) 1 % cream Apply to both feet and between toes twice daily 06/02/18   [place], Stephani Police, DPM  diazepam (VALIUM) 5 MG tablet TAKE 1 TABLET BY MOUTH 1 HOUR BEFORE FLYING AS NEEDED FOR ANXIETY. 02/14/18   [provider]  diclofenac (VOLTAREN) 75 MG EC tablet Take 75 mg by mouth 2 (two) times daily as needed for mild pain (muscle pain).  11/22/17   [provider]  docusate sodium (COLACE) 100 MG capsule TAKE 2 CAPSULES BY MOUTH EVERY DAY AS NEEDED 05/21/18   [provider]  famotidine (PEPCID) 20 MG tablet Take 1 tablet (20 mg total) by mouth 2 (two) times daily. 09/09/17   Gerlene Fee  M, MD  fluconazole (DIFLUCAN) 150 MG tablet TAKE 1 TABLET BY MOUTH EVERY WEEK AS NEEDED. 04/17/18   [provider]  ibuprofen (ADVIL,MOTRIN) 600 MG tablet Take 600 mg by mouth every 6 (six) hours as needed for mild pain or headache. 11/10/17   [provider]  insulin aspart (NOVOLOG FLEXPEN) 100 UNIT/ML FlexPen Inject 15 Units into the skin 3 (three) times daily with meals. 03/04/16   Clayton Bibles, PA-C  Insulin Degludec (TRESIBA FLEXTOUCH) 200 UNIT/ML SOPN Inject 10 Units into the skin 2 (two) times daily. Patient taking differently: Inject 50 Units into the skin at bedtime.  03/04/16   Clayton Bibles, PA-C  lisinopril (PRINIVIL,ZESTRIL) 40 MG tablet Take 40 mg by mouth daily.    [provider]  losartan (COZAAR) 100 MG tablet Take 100 mg by mouth daily. 11/13/17   [provider]  ondansetron (ZOFRAN) 4 MG tablet  Take 1 tablet (4 mg total) by mouth every 8 (eight) hours as needed for nausea or vomiting. 01/19/18   Mesner, Corene Cornea, MD  phentermine (ADIPEX-P) 37.5 MG tablet Take 37.5 mg by mouth every morning. 11/22/17   [provider]    Family History Family History  Problem Relation Age of Onset  . Gout Other   . Diabetes Father   . Heart disease Father   . Diabetes Maternal Grandmother   . Diabetes Maternal Grandfather   . Diabetes Paternal Grandmother   . Diabetes Paternal Grandfather     Social History Social History   Tobacco Use  . Smoking status: Never Smoker  . Smokeless tobacco: Never Used  Substance Use Topics  . Alcohol use: No  . Drug use: No     Allergies   Codeine and Penicillins   Review of Systems Review of Systems  Constitutional: Positive for chills and fever.  Musculoskeletal: Positive for myalgias.  Neurological: Positive for headaches.  All other systems reviewed and are negative.    Physical Exam Updated Vital Signs BP (!) 150/88 (BP Location: Left Arm)   Pulse (!) 111   Temp (!) 102.8 F (39.3 C) (Oral)   Resp 18   Ht 4\' 11"  (1.499 m)   Wt 84.4 kg   SpO2 92%   BMI 37.57 kg/m   Physical Exam Vitals signs and nursing note reviewed.  Constitutional:      General: She is not in acute distress.    Appearance: Normal appearance. She is well-developed.  HENT:     Head: Normocephalic and atraumatic.     Right Ear: Hearing normal.     Left Ear: Hearing normal.     Nose: Nose normal.  Eyes:     Conjunctiva/sclera: Conjunctivae normal.     Pupils: Pupils are equal, round, and reactive to light.  Neck:     Musculoskeletal: Normal range of motion and neck supple.  Cardiovascular:     Rate and Rhythm: Regular rhythm.     Heart sounds: S1 normal and S2 normal. No murmur. No friction rub. No gallop.   Pulmonary:     Effort: Pulmonary effort is normal. No respiratory distress.     Breath sounds: Normal breath sounds.  Chest:     Chest  wall: No tenderness.  Abdominal:     General: Bowel sounds are normal.     Palpations: Abdomen is soft.     Tenderness: There is no abdominal tenderness. There is no guarding or rebound. Negative signs include Murphy's sign and McBurney's sign.     Hernia:  No hernia is present.  Musculoskeletal: Normal range of motion.  Skin:    General: Skin is warm and dry.     Findings: No rash.  Neurological:     Mental Status: She is alert and oriented to person, place, and time.     GCS: GCS eye subscore is 4. GCS verbal subscore is 5. GCS motor subscore is 6.     Cranial Nerves: No cranial nerve deficit.     Sensory: No sensory deficit.     Coordination: Coordination normal.  Psychiatric:        Speech: Speech normal.        Behavior: Behavior normal.        Thought Content: Thought content normal.      ED Treatments / Results  Labs (all labs ordered are listed, but only abnormal results are displayed) Labs Reviewed  SARS CORONAVIRUS 2 (TAT 6-24 HRS)    EKG None  Radiology No results found.  Procedures Procedures (including critical care time)  Medications Ordered in ED Medications  ketorolac (TORADOL) injection 60 mg (has no administration in time range)  acetaminophen (TYLENOL) tablet 650 mg (650 mg Oral Given 12/25/18 2347)     Initial Impression / Assessment and Plan / ED Course  I have reviewed the triage vital signs and the nursing notes.  Pertinent labs & imaging results that were available during my care of the patient were reviewed by me and considered in my medical decision making (see chart for details).        Patient presents with febrile illness.  Symptoms ongoing for 3 days.  She appears well, not ill-appearing at all.  She reports headache which is likely secondary to the fever.  She has a normal neurologic exam, soft and supple, nontender neck.  No meningismus.  Remainder of examination is normal.  Lungs are clear and oxygenation is normal.  She has not  had any respiratory symptoms.  Suspect COVID-19 infection, will test.  No sign of bacterial infection.  Abdominal exam benign, no urinary symptoms.  Patient is diabetic, has been testing her sugars regularly, was 160 at home before coming to the ER.  She will continue to watch closely.  Patient instructed on return precautions for respiratory symptoms.  Otherwise treat fever and symptomatic treatment of additional symptoms as they occur.  Final Clinical Impressions(s) / ED Diagnoses   Final diagnoses:  Fever, unspecified fever cause    ED Discharge Orders    None       , Gwenyth Allegra, MD 12/26/18 [phone]

## 2018-12-26 NOTE — Discharge Instructions (Signed)
Alternate Motrin and Tylenol as needed for fever and headache.  If you develop any difficulty breathing, return to the ER immediately for recheck.

## 2019-01-05 ENCOUNTER — Ambulatory Visit: Payer: Medicaid Other | Admitting: Obstetrics and Gynecology

## 2019-05-18 ENCOUNTER — Ambulatory Visit (INDEPENDENT_AMBULATORY_CARE_PROVIDER_SITE_OTHER): Payer: Medicaid Other | Admitting: Obstetrics and Gynecology

## 2019-05-18 ENCOUNTER — Other Ambulatory Visit (HOSPITAL_COMMUNITY)
Admission: RE | Admit: 2019-05-18 | Discharge: 2019-05-18 | Disposition: A | Discharge status: Home / Self Care | Payer: Medicaid Other | Source: Ambulatory Visit | Attending: Obstetrics and Gynecology | Admitting: Obstetrics and Gynecology

## 2019-05-18 ENCOUNTER — Encounter: Payer: Self-pay | Admitting: Obstetrics and Gynecology

## 2019-05-18 ENCOUNTER — Other Ambulatory Visit: Payer: Self-pay

## 2019-05-18 VITALS — Wt 192.3 lb

## 2019-05-18 DIAGNOSIS — Z01419 Encounter for gynecological examination (general) (routine) without abnormal findings: Secondary | ICD-10-CM | POA: Insufficient documentation

## 2019-05-18 DIAGNOSIS — Z0000 Encounter for general adult medical examination without abnormal findings: Secondary | ICD-10-CM

## 2019-05-18 NOTE — Progress Notes (Signed)
Subjective:     Melanie Luna is a 44 y.o. female P3 WITH bmi 56 who is here for a comprehensive physical exam. The patient reports no problems. Patient is sexually active without complaints. Patient with amenorrhea secondary to ablation. She denies pelvic pain or abnormal discharge. Patient is without complaints.   Past Medical History:  Diagnosis Date  . Complication of anesthesia    slow to wake up  . Diabetes mellitus    takes insulin - type 2  . Fibroid   . Hypertension    takes meds  . Infection    UTI  . Insomnia   . SVD (spontaneous vaginal delivery)    x 2   Past Surgical History:  Procedure Laterality Date  . CESAREAN SECTION  2003   x 1  . DILITATION & CURRETTAGE/HYSTROSCOPY WITH HYDROTHERMAL ABLATION N/A 11/11/2016   Procedure: DILATATION & CURETTAGE/HYSTEROSCOPY WITH HYDROTHERMAL ABLATION;  Surgeon: Emily Filbert, MD;  Location: [facility];  Service: Gynecology;  Laterality: N/A;  . ENDOMETRIAL ABLATION  2003  . HYSTEROSCOPY WITH D & C N/A 06/22/2016   Procedure: DILATATION AND CURETTAGE /HYSTEROSCOPY;  Surgeon: Emily Filbert, MD;  Location: [facility];  Service: Gynecology;  Laterality: N/A;  . TUBAL LIGATION  2003   Family History  Problem Relation Age of Onset  . Gout Other   . Diabetes Father   . Heart disease Father   . Diabetes Maternal Grandmother   . Diabetes Maternal Grandfather   . Diabetes Paternal Grandmother   . Diabetes Paternal Grandfather     Social History   Socioeconomic History  . Marital status: Single    Spouse name: Not on file  . Number of children: Not on file  . Years of education: Not on file  . Highest education level: Not on file  Occupational History  . Not on file  Tobacco Use  . Smoking status: Never Smoker  . Smokeless tobacco: Never Used  Substance and Sexual Activity  . Alcohol use: No  . Drug use: No  . Sexual activity: Yes    Birth control/protection: Surgical  Other Topics Concern  . Not on file  Social History  Narrative  . Not on file   Social Determinants of Health   Financial Resource Strain:   . Difficulty of Paying Living Expenses:   Food Insecurity:   . Worried About Charity fundraiser in the Last Year:   . Arboriculturist in the Last Year:   Transportation Needs:   . Film/video editor (Medical):   Marland Kitchen Lack of Transportation (Non-Medical):   Physical Activity:   . Days of Exercise per Week:   . Minutes of Exercise per Session:   Stress:   . Feeling of Stress :   Social Connections:   . Frequency of Communication with Friends and Family:   . Frequency of Social Gatherings with Friends and Family:   . Attends Religious Services:   . Active Member of Clubs or Organizations:   . Attends Archivist Meetings:   Marland Kitchen Marital Status:   Intimate Partner Violence:   . Fear of Current or Ex-Partner:   . Emotionally Abused:   Marland Kitchen Physically Abused:   . Sexually Abused:    Health Maintenance  Topic Date Due  . COVID-19 Vaccine (1) Never done  . TETANUS/TDAP  Never done  . PAP SMEAR-Modifier  05/27/2019  . INFLUENZA VACCINE  08/26/2019  . HIV Screening  Completed  Review of Systems Pertinent items noted in HPI and remainder of comprehensive ROS otherwise negative.   Objective:  Weight 192 lb 4.8 oz (87.2 kg).      GENERAL: Well-developed, well-nourished female in no acute distress.  HEENT: Normocephalic, atraumatic. Sclerae anicteric.  NECK: Supple. Normal thyroid.  LUNGS: Clear to auscultation bilaterally.  HEART: Regular rate and rhythm. BREASTS: Symmetric in size. No palpable masses or lymphadenopathy, skin changes, or nipple drainage. ABDOMEN: Soft, nontender, nondistended. No organomegaly. PELVIC: Normal external female genitalia. Vagina is pink and rugated.  Normal discharge. Normal appearing cervix. Uterus is normal in size.  No adnexal mass or tenderness. EXTREMITIES: No cyanosis, clubbing, or edema, 2+ distal pulses.    Assessment:    Healthy  female exam.      Plan:    Pap smear collected Patient with normal mammogram 11/2018 Health maintenance and STI screening per patient request See After Visit Summary for Counseling Recommendations

## 2019-05-19 LAB — COMPREHENSIVE METABOLIC PANEL
ALT: 14 IU/L (ref 0–32)
AST: 11 IU/L (ref 0–40)
Albumin/Globulin Ratio: 1.3 (ref 1.2–2.2)
Albumin: 3.9 g/dL (ref 3.8–4.8)
Alkaline Phosphatase: 164 IU/L — ABNORMAL HIGH (ref 39–117)
BUN/Creatinine Ratio: 19 (ref 9–23)
BUN: 13 mg/dL (ref 6–24)
Bilirubin Total: <0.2 mg/dL (ref 0.0–1.2)
CO2: 25 mmol/L (ref 20–29)
Calcium: 9.6 mg/dL (ref 8.7–10.2)
Chloride: 100 mmol/L (ref 96–106)
Creatinine, Ser: 0.69 mg/dL (ref 0.57–1.00)
GFR calc Af Amer: 123 mL/min/{1.73_m2} (ref >59)
GFR calc non Af Amer: 107 mL/min/{1.73_m2} (ref >59)
Globulin, Total: 2.9 g/dL (ref 1.5–4.5)
Glucose: 108 mg/dL — ABNORMAL HIGH (ref 65–99)
Potassium: 4.7 mmol/L (ref 3.5–5.2)
Sodium: 138 mmol/L (ref 134–144)
Total Protein: 6.8 g/dL (ref 6.0–8.5)

## 2019-05-19 LAB — CBC
Hematocrit: 37.7 % (ref 34.0–46.6)
Hemoglobin: 12.5 g/dL (ref 11.1–15.9)
MCH: 26.7 pg (ref 26.6–33.0)
MCHC: 33.2 g/dL (ref 31.5–35.7)
MCV: 80 fL (ref 79–97)
Platelets: 318 10*3/uL (ref 150–450)
RBC: 4.69 x10E6/uL (ref 3.77–5.28)
RDW: 13 % (ref 11.7–15.4)
WBC: 9.7 10*3/uL (ref 3.4–10.8)

## 2019-05-19 LAB — LIPID PANEL
Chol/HDL Ratio: 3.3 ratio (ref 0.0–4.4)
Cholesterol, Total: 212 mg/dL — ABNORMAL HIGH (ref 100–199)
HDL: 65 mg/dL (ref >39)
LDL Chol Calc (NIH): 121 mg/dL — ABNORMAL HIGH (ref 0–99)
Triglycerides: 150 mg/dL — ABNORMAL HIGH (ref 0–149)
VLDL Cholesterol Cal: 26 mg/dL (ref 5–40)

## 2019-05-19 LAB — HIV ANTIBODY (ROUTINE TESTING W REFLEX): HIV Screen 4th Generation wRfx: NONREACTIVE

## 2019-05-19 LAB — HEMOGLOBIN A1C
Est. average glucose Bld gHb Est-mCnc: 280 mg/dL
Hgb A1c MFr Bld: 11.4 % — ABNORMAL HIGH (ref 4.8–5.6)

## 2019-05-19 LAB — RPR: RPR Ser Ql: NONREACTIVE

## 2019-05-19 LAB — HEPATITIS C ANTIBODY: Hep C Virus Ab: <0.1 s/co ratio (ref 0.0–0.9)

## 2019-05-19 LAB — HEPATITIS B SURFACE ANTIGEN: Hepatitis B Surface Ag: NEGATIVE

## 2019-05-19 LAB — TSH: TSH: 2.38 u[IU]/mL (ref 0.450–4.500)

## 2019-05-23 LAB — CYTOLOGY - PAP
Adequacy: ABSENT
Chlamydia: NEGATIVE
Comment: NEGATIVE
Comment: NEGATIVE
Comment: NORMAL
Diagnosis: NEGATIVE
High risk HPV: NEGATIVE
Neisseria Gonorrhea: NEGATIVE

## 2020-09-23 ENCOUNTER — Other Ambulatory Visit: Payer: Self-pay | Admitting: Internal Medicine

## 2020-09-26 LAB — CBC
HCT: 38.4 % (ref 35.0–45.0)
Hemoglobin: 12 g/dL (ref 11.7–15.5)
MCH: 26.2 pg — ABNORMAL LOW (ref 27.0–33.0)
MCHC: 31.3 g/dL — ABNORMAL LOW (ref 32.0–36.0)
MCV: 83.8 fL (ref 80.0–100.0)
MPV: 11.4 fL (ref 7.5–12.5)
Platelets: 280 10*3/uL (ref 140–400)
RBC: 4.58 10*6/uL (ref 3.80–5.10)
RDW: 12.7 % (ref 11.0–15.0)
WBC: 9.5 10*3/uL (ref 3.8–10.8)

## 2020-09-26 LAB — LIPID PANEL
Cholesterol: 205 mg/dL — ABNORMAL HIGH (ref <200)
HDL: 63 mg/dL (ref >=50)
LDL Cholesterol (Calc): 119 mg/dL (calc) — ABNORMAL HIGH
Non-HDL Cholesterol (Calc): 142 mg/dL (calc) — ABNORMAL HIGH (ref <130)
Total CHOL/HDL Ratio: 3.3 (calc) (ref <5.0)
Triglycerides: 123 mg/dL (ref <150)

## 2020-09-26 LAB — COMPLETE METABOLIC PANEL WITH GFR
AG Ratio: 1.2 (calc) (ref 1.0–2.5)
ALT: 11 U/L (ref 6–29)
AST: 10 U/L (ref 10–30)
Albumin: 3.6 g/dL (ref 3.6–5.1)
Alkaline phosphatase (APISO): 117 U/L (ref 31–125)
BUN: 10 mg/dL (ref 7–25)
CO2: 24 mmol/L (ref 20–32)
Calcium: 9 mg/dL (ref 8.6–10.2)
Chloride: 101 mmol/L (ref 98–110)
Creat: 0.6 mg/dL (ref 0.50–0.99)
Globulin: 3.1 g/dL (calc) (ref 1.9–3.7)
Glucose, Bld: 148 mg/dL — ABNORMAL HIGH (ref 65–99)
Potassium: 4.2 mmol/L (ref 3.5–5.3)
Sodium: 139 mmol/L (ref 135–146)
Total Bilirubin: 0.2 mg/dL (ref 0.2–1.2)
Total Protein: 6.7 g/dL (ref 6.1–8.1)
eGFR: 113 mL/min/{1.73_m2} (ref >=60)

## 2020-09-26 LAB — URINE CULTURE
MICRO NUMBER:: 12309908
SPECIMEN QUALITY:: ADEQUATE

## 2020-09-26 LAB — TSH: TSH: 1.96 mIU/L

## 2020-09-26 LAB — VITAMIN D 25 HYDROXY (VIT D DEFICIENCY, FRACTURES): Vit D, 25-Hydroxy: 30 ng/mL (ref 30–100)

## 2021-07-17 ENCOUNTER — Other Ambulatory Visit: Payer: Self-pay | Admitting: Internal Medicine

## 2021-07-19 LAB — URINE CULTURE
MICRO NUMBER:: 13564991
SPECIMEN QUALITY:: ADEQUATE

## 2021-11-16 ENCOUNTER — Other Ambulatory Visit: Payer: Self-pay | Admitting: Internal Medicine

## 2021-11-18 LAB — CBC
HCT: 35.3 % (ref 35.0–45.0)
Hemoglobin: 11.7 g/dL (ref 11.7–15.5)
MCH: 27.3 pg (ref 27.0–33.0)
MCHC: 33.1 g/dL (ref 32.0–36.0)
MCV: 82.5 fL (ref 80.0–100.0)
MPV: 11.5 fL (ref 7.5–12.5)
Platelets: 318 10*3/uL (ref 140–400)
RBC: 4.28 10*6/uL (ref 3.80–5.10)
RDW: 12.7 % (ref 11.0–15.0)
WBC: 11.4 10*3/uL — ABNORMAL HIGH (ref 3.8–10.8)

## 2021-11-18 LAB — COMPLETE METABOLIC PANEL WITH GFR
AG Ratio: 1.3 (calc) (ref 1.0–2.5)
ALT: 13 U/L (ref 6–29)
AST: 12 U/L (ref 10–35)
Albumin: 3.5 g/dL — ABNORMAL LOW (ref 3.6–5.1)
Alkaline phosphatase (APISO): 162 U/L — ABNORMAL HIGH (ref 31–125)
BUN: 15 mg/dL (ref 7–25)
CO2: 25 mmol/L (ref 20–32)
Calcium: 8.8 mg/dL (ref 8.6–10.2)
Chloride: 101 mmol/L (ref 98–110)
Creat: 0.68 mg/dL (ref 0.50–0.99)
Globulin: 2.7 g/dL (calc) (ref 1.9–3.7)
Glucose, Bld: 253 mg/dL — ABNORMAL HIGH (ref 65–99)
Potassium: 4.3 mmol/L (ref 3.5–5.3)
Sodium: 136 mmol/L (ref 135–146)
Total Bilirubin: 0.1 mg/dL — ABNORMAL LOW (ref 0.2–1.2)
Total Protein: 6.2 g/dL (ref 6.1–8.1)
eGFR: 109 mL/min/{1.73_m2} (ref >=60)

## 2021-11-18 LAB — LIPID PANEL
Cholesterol: 214 mg/dL — ABNORMAL HIGH (ref <200)
HDL: 59 mg/dL (ref >=50)
LDL Cholesterol (Calc): 114 mg/dL (calc) — ABNORMAL HIGH
Non-HDL Cholesterol (Calc): 155 mg/dL (calc) — ABNORMAL HIGH (ref <130)
Total CHOL/HDL Ratio: 3.6 (calc) (ref <5.0)
Triglycerides: 307 mg/dL — ABNORMAL HIGH (ref <150)

## 2021-11-18 LAB — VITAMIN D 25 HYDROXY (VIT D DEFICIENCY, FRACTURES): Vit D, 25-Hydroxy: 18 ng/mL — ABNORMAL LOW (ref 30–100)

## 2021-11-18 LAB — MICROALBUMIN / CREATININE URINE RATIO

## 2021-11-18 LAB — TSH: TSH: 1.33 mIU/L

## 2022-04-30 ENCOUNTER — Other Ambulatory Visit: Payer: Self-pay | Admitting: Internal Medicine

## 2022-04-30 DIAGNOSIS — E2839 Other primary ovarian failure: Secondary | ICD-10-CM

## 2022-04-30 DIAGNOSIS — N6320 Unspecified lump in the left breast, unspecified quadrant: Secondary | ICD-10-CM

## 2022-04-30 DIAGNOSIS — Z0000 Encounter for general adult medical examination without abnormal findings: Secondary | ICD-10-CM

## 2022-05-12 ENCOUNTER — Ambulatory Visit
Admission: RE | Admit: 2022-05-12 | Discharge: 2022-05-12 | Disposition: A | Discharge status: Home / Self Care | Payer: Medicaid Other | Source: Ambulatory Visit | Attending: Internal Medicine | Admitting: Internal Medicine

## 2022-05-12 DIAGNOSIS — N6320 Unspecified lump in the left breast, unspecified quadrant: Secondary | ICD-10-CM

## 2022-08-16 ENCOUNTER — Other Ambulatory Visit: Payer: Self-pay | Admitting: Internal Medicine

## 2022-08-17 LAB — BASIC METABOLIC PANEL WITH GFR
BUN: 17 mg/dL (ref 7–25)
CO2: 26 mmol/L (ref 20–32)
Calcium: 9.7 mg/dL (ref 8.6–10.2)
Chloride: 103 mmol/L (ref 98–110)
Creat: 0.74 mg/dL (ref 0.50–0.99)
Glucose, Bld: 125 mg/dL — ABNORMAL HIGH (ref 65–99)
Potassium: 4.7 mmol/L (ref 3.5–5.3)
Sodium: 139 mmol/L (ref 135–146)
eGFR: 101 mL/min/{1.73_m2} (ref >=60)

## 2022-08-17 LAB — CBC
HCT: 40.9 % (ref 35.0–45.0)
Hemoglobin: 12.9 g/dL (ref 11.7–15.5)
MCH: 26.6 pg — ABNORMAL LOW (ref 27.0–33.0)
MCHC: 31.5 g/dL — ABNORMAL LOW (ref 32.0–36.0)
MCV: 84.3 fL (ref 80.0–100.0)
MPV: 11.8 fL (ref 7.5–12.5)
Platelets: 325 10*3/uL (ref 140–400)
RBC: 4.85 10*6/uL (ref 3.80–5.10)
RDW: 12.7 % (ref 11.0–15.0)
WBC: 9.6 10*3/uL (ref 3.8–10.8)

## 2022-08-17 LAB — LIPID PANEL
Cholesterol: 294 mg/dL — ABNORMAL HIGH (ref <200)
HDL: 75 mg/dL (ref >=50)
LDL Cholesterol (Calc): 188 mg/dL (calc) — ABNORMAL HIGH
Non-HDL Cholesterol (Calc): 219 mg/dL (calc) — ABNORMAL HIGH (ref <130)
Total CHOL/HDL Ratio: 3.9 (calc) (ref <5.0)
Triglycerides: 159 mg/dL — ABNORMAL HIGH (ref <150)

## 2022-11-04 ENCOUNTER — Inpatient Hospital Stay: Admission: RE | Admit: 2022-11-04 | Payer: Medicaid Other | Source: Ambulatory Visit

## 2022-11-11 ENCOUNTER — Other Ambulatory Visit: Payer: Self-pay | Admitting: Internal Medicine

## 2022-11-11 DIAGNOSIS — E2839 Other primary ovarian failure: Secondary | ICD-10-CM

## 2022-12-19 ENCOUNTER — Other Ambulatory Visit: Payer: Self-pay

## 2022-12-19 ENCOUNTER — Emergency Department (HOSPITAL_COMMUNITY)
Admission: EM | Admit: 2022-12-19 | Discharge: 2022-12-19 | Disposition: A | Discharge status: Home / Self Care | Payer: Medicaid Other | Attending: Emergency Medicine | Admitting: Emergency Medicine

## 2022-12-19 ENCOUNTER — Encounter (HOSPITAL_COMMUNITY): Payer: Self-pay

## 2022-12-19 DIAGNOSIS — R42 Dizziness and giddiness: Secondary | ICD-10-CM | POA: Insufficient documentation

## 2022-12-19 LAB — BASIC METABOLIC PANEL
Anion gap: 9 (ref 5–15)
BUN: 15 mg/dL (ref 6–20)
CO2: 25 mmol/L (ref 22–32)
Calcium: 9.2 mg/dL (ref 8.9–10.3)
Chloride: 100 mmol/L (ref 98–111)
Creatinine, Ser: 0.65 mg/dL (ref 0.44–1.00)
GFR, Estimated: >60 mL/min (ref >60)
Glucose, Bld: 166 mg/dL — ABNORMAL HIGH (ref 70–99)
Potassium: 4 mmol/L (ref 3.5–5.1)
Sodium: 134 mmol/L — ABNORMAL LOW (ref 135–145)

## 2022-12-19 LAB — URINALYSIS, ROUTINE W REFLEX MICROSCOPIC
Bilirubin Urine: NEGATIVE
Glucose, UA: >=500 mg/dL — AB
Hgb urine dipstick: NEGATIVE
Ketones, ur: NEGATIVE mg/dL
Leukocytes,Ua: NEGATIVE
Nitrite: NEGATIVE
Protein, ur: 30 mg/dL — AB
Specific Gravity, Urine: 1.021 (ref 1.005–1.030)
pH: 6 (ref 5.0–8.0)

## 2022-12-19 LAB — CBC
HCT: 38.2 % (ref 36.0–46.0)
Hemoglobin: 12.4 g/dL (ref 12.0–15.0)
MCH: 27.4 pg (ref 26.0–34.0)
MCHC: 32.5 g/dL (ref 30.0–36.0)
MCV: 84.3 fL (ref 80.0–100.0)
Platelets: 258 10*3/uL (ref 150–400)
RBC: 4.53 MIL/uL (ref 3.87–5.11)
RDW: 12.3 % (ref 11.5–15.5)
WBC: 10.3 10*3/uL (ref 4.0–10.5)
nRBC: 0 % (ref 0.0–0.2)

## 2022-12-19 LAB — TSH: TSH: 2.04 u[IU]/mL (ref 0.350–4.500)

## 2022-12-19 LAB — HCG, SERUM, QUALITATIVE: Preg, Serum: NEGATIVE

## 2022-12-19 LAB — CBG MONITORING, ED
Glucose-Capillary: 298 mg/dL — ABNORMAL HIGH (ref 70–99)
Glucose-Capillary: 232 mg/dL — ABNORMAL HIGH (ref 70–99)

## 2022-12-19 LAB — MAGNESIUM: Magnesium: 1.8 mg/dL (ref 1.7–2.4)

## 2022-12-19 NOTE — Discharge Instructions (Signed)
You have been seen and discharged from the emergency department. Your blood work and urine was normal.   Follow-up with your primary provider for further evaluation and further care. Take home medications as prescribed. If you have any worsening symptoms or further concerns for your health please return to an emergency department for further evaluation.

## 2022-12-19 NOTE — ED Provider Notes (Signed)
[place] EMERGENCY DEPARTMENT AT [facility] Provider Note   CSN: [id number] Arrival date & time: 12/19/22  1354     History  Chief Complaint  Patient presents with   Dizziness    Eveline Kamaya Carleo is a 47 y.o. female.  HPI   47 year old female presents emergency department with multiple complaints.  Over the past week the patient has concern for hot flashes, "spells, fluctuating blood sugar, as well as positional lightheadedness.  Patient states she been having urinary frequency and concerned she has a urinary tract infection.  She has history of vertigo.  She states that these symptoms are similar from a positional standpoint, admits to a room spinning sensation.  It her current symptoms are not as severe as her previous episode of vertigo.  She denies any cough, fever, vomiting or diarrhea.  She has no chest pain, abdominal pain or back pain.  She has no focal weakness, numbness or complaints of ataxia.  She has no ongoing dizziness at rest.  No headache or vision changes.  Home Medications Prior to Admission medications   Medication Sig Start Date End Date Taking? Authorizing Provider  amLODipine (NORVASC) 5 MG tablet Take 5 mg by mouth daily. 08/22/17   [provider]  ciclopirox (PENLAC) 8 % solution Apply topically at bedtime. Apply over nail and surrounding skin. Apply daily over previous coat. Remove weekly with polish remover. Patient taking differently: Apply 1 application topically at bedtime. Apply over nail and surrounding skin. (Feet) 12/02/17   Freddie Breech, DPM  clotrimazole (LOTRIMIN) 1 % cream Apply to both feet and between toes twice daily Patient not taking: Reported on 05/18/2019 06/02/18   Freddie Breech, DPM  diazepam (VALIUM) 5 MG tablet TAKE 1 TABLET BY MOUTH 1 HOUR BEFORE FLYING AS NEEDED FOR ANXIETY. 02/14/18   [provider]  diclofenac (VOLTAREN) 75 MG EC tablet Take 75 mg by mouth 2 (two) times daily as needed for  mild pain (muscle pain).  11/22/17   [provider]  docusate sodium (COLACE) 100 MG capsule TAKE 2 CAPSULES BY MOUTH EVERY DAY AS NEEDED 05/21/18   [provider]  famotidine (PEPCID) 20 MG tablet Take 1 tablet (20 mg total) by mouth 2 (two) times daily. 09/09/17   Sabas Sous, MD  fluconazole (DIFLUCAN) 150 MG tablet TAKE 1 TABLET BY MOUTH EVERY WEEK AS NEEDED. 04/17/18   [provider]  ibuprofen (ADVIL,MOTRIN) 600 MG tablet Take 600 mg by mouth every 6 (six) hours as needed for mild pain or headache. 11/10/17   [provider]  insulin aspart (NOVOLOG FLEXPEN) 100 UNIT/ML FlexPen Inject 15 Units into the skin 3 (three) times daily with meals. 03/04/16   Trixie Dredge, PA-C  Insulin Degludec (TRESIBA FLEXTOUCH) 200 UNIT/ML SOPN Inject 10 Units into the skin 2 (two) times daily. Patient taking differently: Inject 50 Units into the skin at bedtime.  03/04/16   Trixie Dredge, PA-C  lisinopril (PRINIVIL,ZESTRIL) 40 MG tablet Take 40 mg by mouth daily.    [provider]  losartan (COZAAR) 100 MG tablet Take 100 mg by mouth daily. 11/13/17   [provider]  ondansetron (ZOFRAN) 4 MG tablet Take 1 tablet (4 mg total) by mouth every 8 (eight) hours as needed for nausea or vomiting. 01/19/18   Mesner, Barbara Cower, MD  phentermine (ADIPEX-P) 37.5 MG tablet Take 37.5 mg by mouth every morning. 11/22/17   [provider]      Allergies  Codeine and Penicillins    Review of Systems   Review of Systems  Constitutional:  Positive for fatigue. Negative for fever.  Respiratory:  Negative for shortness of breath.   Cardiovascular:  Negative for chest pain.  Gastrointestinal:  Negative for abdominal pain, diarrhea and vomiting.  Endocrine: Positive for cold intolerance and heat intolerance.  Genitourinary:  Positive for frequency.  Skin:  Negative for rash.  Neurological:  Positive for light-headedness. Negative for syncope, facial asymmetry, speech  difficulty and headaches.    Physical Exam Updated Vital Signs BP (!) 180/97 (BP Location: Left Arm)   Pulse 87   Temp 98.3 F (36.8 C) (Oral)   Resp 19   Ht 4\' 11"  (1.499 m)   Wt 87.2 kg   SpO2 97%   BMI 38.83 kg/m  Physical Exam Vitals and nursing note reviewed.  Constitutional:      General: She is not in acute distress.    Appearance: Normal appearance.  HENT:     Head: Normocephalic.     Mouth/Throat:     Mouth: Mucous membranes are moist.  Eyes:     Pupils: Pupils are equal, round, and reactive to light.  Cardiovascular:     Rate and Rhythm: Normal rate.  Pulmonary:     Effort: Pulmonary effort is normal. No respiratory distress.  Abdominal:     Palpations: Abdomen is soft.     Tenderness: There is no abdominal tenderness.  Skin:    General: Skin is warm.  Neurological:     General: No focal deficit present.     Mental Status: She is alert and oriented to person, place, and time. Mental status is at baseline.  Psychiatric:        Mood and Affect: Mood normal.     ED Results / Procedures / Treatments   Labs (all labs ordered are listed, but only abnormal results are displayed) Labs Reviewed  BASIC METABOLIC PANEL - Abnormal; Notable for the following components:      Result Value   Sodium 134 (*)    Glucose, Bld 166 (*)    All other components within normal limits  CBG MONITORING, ED - Abnormal; Notable for the following components:   Glucose-Capillary 298 (*)    All other components within normal limits  CBG MONITORING, ED - Abnormal; Notable for the following components:   Glucose-Capillary 232 (*)    All other components within normal limits  CBC  MAGNESIUM  URINALYSIS, ROUTINE W REFLEX MICROSCOPIC  HCG, SERUM, QUALITATIVE  TSH    EKG EKG Interpretation Date/Time:  Sunday December 19 2022 14:01:16 EST Ventricular Rate:  88 PR Interval:  145 QRS Duration:  79 QT Interval:  359 QTC Calculation: 435 R Axis:   41  Text  Interpretation: Sinus rhythm Low voltage, precordial leads Confirmed by Coralee Pesa ([id number]) on 12/19/2022 3:24:30 PM  Radiology No results found.  Procedures Procedures    Medications Ordered in ED Medications - No data to display  ED Course/ Medical Decision Making/ A&P                                 Medical Decision Making Amount and/or Complexity of Data Reviewed Labs: ordered.   47 year old female presents emergency room with multiple complaints.  Including positional lightheadedness, resolved with rest.  Cold and hot sweats.  General fatigue.  Vital signs are normal and stable on arrival.  While laying and  talking in bed she is asymptomatic, no lightheadedness/dizziness.  She has no focal neurologic complaint.  She is otherwise very well-appearing.  She was able to sit up and continue conversation without any symptoms.  Given the resolution of these symptoms at rest have low suspicion for central cause/CVA.  Blood work is normal without any acute abnormality, urinalysis is normal.  TSH is normal.  Magnesium is normal.  No findings of any emergent condition.  Patient will be referred outpatient for ongoing treatment.  Patient at this time appears safe and stable for discharge and close outpatient follow up. Discharge plan and strict return to ED precautions discussed, patient verbalizes understanding and agreement.        Final Clinical Impression(s) / ED Diagnoses Final diagnoses:  None    Rx / DC Orders ED Discharge Orders     None         Rozelle Logan, DO 12/19/22 1805

## 2022-12-19 NOTE — ED Triage Notes (Signed)
Patient is here for evaluation of dizziness, feeling like she is going to pass out, having hot and cold spells as well. Pt reports her blood sugar "has been all over the place" Reports highest is >500 and lowest was in the 60's. Patient reports that her blood sugar is normally 250-300.

## 2023-02-17 ENCOUNTER — Emergency Department (HOSPITAL_COMMUNITY)
Admission: EM | Admit: 2023-02-17 | Discharge: 2023-02-17 | Disposition: A | Discharge status: Home / Self Care | Payer: Medicaid Other | Attending: Emergency Medicine | Admitting: Emergency Medicine

## 2023-02-17 ENCOUNTER — Other Ambulatory Visit: Payer: Self-pay

## 2023-02-17 DIAGNOSIS — J101 Influenza due to other identified influenza virus with other respiratory manifestations: Secondary | ICD-10-CM | POA: Diagnosis not present

## 2023-02-17 DIAGNOSIS — E119 Type 2 diabetes mellitus without complications: Secondary | ICD-10-CM | POA: Diagnosis not present

## 2023-02-17 DIAGNOSIS — Z20822 Contact with and (suspected) exposure to covid-19: Secondary | ICD-10-CM | POA: Insufficient documentation

## 2023-02-17 DIAGNOSIS — Z794 Long term (current) use of insulin: Secondary | ICD-10-CM | POA: Insufficient documentation

## 2023-02-17 DIAGNOSIS — I10 Essential (primary) hypertension: Secondary | ICD-10-CM | POA: Insufficient documentation

## 2023-02-17 DIAGNOSIS — R059 Cough, unspecified: Secondary | ICD-10-CM | POA: Diagnosis present

## 2023-02-17 DIAGNOSIS — Z79899 Other long term (current) drug therapy: Secondary | ICD-10-CM | POA: Diagnosis not present

## 2023-02-17 LAB — RESP PANEL BY RT-PCR (RSV, FLU A&B, COVID)  RVPGX2
Influenza A by PCR: POSITIVE — AB
Influenza B by PCR: NEGATIVE
Resp Syncytial Virus by PCR: NEGATIVE
SARS Coronavirus 2 by RT PCR: NEGATIVE

## 2023-02-17 NOTE — ED Provider Notes (Signed)
[place] EMERGENCY DEPARTMENT AT [facility] Provider Note   CSN: [id number] Arrival date & time: 02/17/23  1850     History  Chief Complaint  Patient presents with   Flu like symptoms    Melanie Luna is a 48 y.o. female with history of diabetes, hypertension, presents with concern for fevers, body aches, dry cough, nausea for the past 2 days.  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  She has been taking Tylenol at home for her fever which helps to bring it down and using Zofran at home help with her nausea.  HPI     Home Medications Prior to Admission medications   Medication Sig Start Date End Date Taking? Authorizing Provider  amLODipine (NORVASC) 5 MG tablet Take 5 mg by mouth daily. 08/22/17   [provider]  ciclopirox (PENLAC) 8 % solution Apply topically at bedtime. Apply over nail and surrounding skin. Apply daily over previous coat. Remove weekly with polish remover. Patient taking differently: Apply 1 application topically at bedtime. Apply over nail and surrounding skin. (Feet) 12/02/17   Freddie Breech, DPM  clotrimazole (LOTRIMIN) 1 % cream Apply to both feet and between toes twice daily Patient not taking: Reported on 05/18/2019 06/02/18   Freddie Breech, DPM  diazepam (VALIUM) 5 MG tablet TAKE 1 TABLET BY MOUTH 1 HOUR BEFORE FLYING AS NEEDED FOR ANXIETY. 02/14/18   [provider]  diclofenac (VOLTAREN) 75 MG EC tablet Take 75 mg by mouth 2 (two) times daily as needed for mild pain (muscle pain).  11/22/17   [provider]  docusate sodium (COLACE) 100 MG capsule TAKE 2 CAPSULES BY MOUTH EVERY DAY AS NEEDED 05/21/18   [provider]  famotidine (PEPCID) 20 MG tablet Take 1 tablet (20 mg total) by mouth 2 (two) times daily. 09/09/17   Sabas Sous, MD  fluconazole (DIFLUCAN) 150 MG tablet TAKE 1 TABLET BY MOUTH EVERY WEEK AS NEEDED. 04/17/18   [provider]  ibuprofen  (ADVIL,MOTRIN) 600 MG tablet Take 600 mg by mouth every 6 (six) hours as needed for mild pain or headache. 11/10/17   [provider]  insulin aspart (NOVOLOG FLEXPEN) 100 UNIT/ML FlexPen Inject 15 Units into the skin 3 (three) times daily with meals. 03/04/16   Trixie Dredge, PA-C  Insulin Degludec (TRESIBA FLEXTOUCH) 200 UNIT/ML SOPN Inject 10 Units into the skin 2 (two) times daily. Patient taking differently: Inject 50 Units into the skin at bedtime.  03/04/16   Trixie Dredge, PA-C  lisinopril (PRINIVIL,ZESTRIL) 40 MG tablet Take 40 mg by mouth daily.    [provider]  losartan (COZAAR) 100 MG tablet Take 100 mg by mouth daily. 11/13/17   [provider]  ondansetron (ZOFRAN) 4 MG tablet Take 1 tablet (4 mg total) by mouth every 8 (eight) hours as needed for nausea or vomiting. 01/19/18   Mesner, Barbara Cower, MD  phentermine (ADIPEX-P) 37.5 MG tablet Take 37.5 mg by mouth every morning. 11/22/17   [provider]      Allergies    Codeine and Penicillins    Review of Systems   Review of Systems  Constitutional:  Positive for fever.    Physical Exam Updated Vital Signs BP (!) 159/100   Pulse 97   Temp 99.2 F (37.3 C)   Resp 16   Ht 4\' 11"  (1.499 m)   Wt 87.2 kg   SpO2 100%   BMI  38.83 kg/m  Physical Exam Vitals and nursing note reviewed.  Constitutional:      General: She is not in acute distress.    Appearance: She is well-developed.     Comments: Patient sounds congested and has a dry cough when talking to her  HENT:     Head: Normocephalic and atraumatic.     Mouth/Throat:     Pharynx: No oropharyngeal exudate or posterior oropharyngeal erythema.  Eyes:     Conjunctiva/sclera: Conjunctivae normal.  Cardiovascular:     Rate and Rhythm: Normal rate and regular rhythm.     Heart sounds: No murmur heard. Pulmonary:     Effort: Pulmonary effort is normal. No respiratory distress.     Breath sounds: Normal breath sounds.  Abdominal:      Palpations: Abdomen is soft.     Tenderness: There is no abdominal tenderness.  Musculoskeletal:        General: No swelling.     Cervical back: Neck supple.  Lymphadenopathy:     Cervical: Cervical adenopathy present.  Skin:    General: Skin is warm and dry.     Capillary Refill: Capillary refill takes less than 2 seconds.  Neurological:     Mental Status: She is alert.  Psychiatric:        Mood and Affect: Mood normal.     ED Results / Procedures / Treatments   Labs (all labs ordered are listed, but only abnormal results are displayed) Labs Reviewed  RESP PANEL BY RT-PCR (RSV, FLU A&B, COVID)  RVPGX2 - Abnormal; Notable for the following components:      Result Value   Influenza A by PCR POSITIVE (*)    All other components within normal limits    EKG None  Radiology No results found.  Procedures Procedures    Medications Ordered in ED Medications - No data to display  ED Course/ Medical Decision Making/ A&P                                 Medical Decision Making    Differential diagnosis includes but is not limited to COVID, flu, RSV, viral URI, strep pharyngitis, viral pharyngitis, allergic rhinitis, pneumonia, bronchitis   ED Course:  Patient appears sick but no acute distress.  Vital signs stable aside from a elevated blood pressure of 159/100 upon arrival.  Sounds congested on exam and has a dry cough.  Lungs clear to auscultation bilaterally.  Her temperature is 99.2, but she states she took Tylenol for her fever prior to getting here.  I ordered and personally reviewed and interpreted labs.  Influenza A positive.  COVID and RSV negative.  No concern for pneumonia at this time given symptoms to started, lungs clear to auscultation.  Patient stable and appropriate for discharge home this time  Impression: Influenza A  Disposition:  The patient was discharged home with instructions to use Tylenol and ibuprofen as needed for pain and fever control.   Over-the-counter medications as needed for symptom control.  Patient states she already has Zofran at home, will not prescribe any more at this time for her nausea.  Follow-up with PCP if symptoms not improved in the next week, and within the next month for recheck of blood pressure.  I discussed that she must be fever free for 24 hours without the use of fever reducing medication before she can return to work.  She was provided a  work note. Return precautions given.              Final Clinical Impression(s) / ED Diagnoses Final diagnoses:  Influenza A    Rx / DC Orders ED Discharge Orders     None         Arabella Merles, Cordelia Poche 02/17/23 2137    Bethann Berkshire, MD 02/21/23 1225

## 2023-02-17 NOTE — Discharge Instructions (Addendum)
You tested positive for the flu today.  Your COVID, RSV are negative.  Your blood pressure is elevated today.  Please follow-up with your PCP regarding further blood pressure management within the next month.  Home care instructions:  You can take Tylenol and/or Ibuprofen as directed on the packaging for fever reduction and pain relief.    For cough: honey 1/2 to 1 teaspoon (you can dilute the honey in water or another fluid).  You can also use guaifenesin and dextromethorphan for cough which are over-the-counter medications. You can use a humidifier for chest congestion and cough.  If you don't have a humidifier, you can sit in the bathroom with the hot shower running.      For sore throat: try warm salt water gargles, cepacol lozenges, throat spray, warm tea or water with lemon/honey, popsicles or ice, or OTC cold relief medicine for throat discomfort.    For congestion: Flonase 1-2 sprays in each nostril daily.    It is important to stay hydrated: drink plenty of fluids (water, gatorade/powerade/pedialyte, juices, or teas) to keep your throat moisturized and help further relieve irritation/discomfort.   Your illness is contagious and can be spread to others, especially during the first 3 or 4 days. It cannot be cured by antibiotics or other medicines. Take basic precautions such as wearing a mask, washing your hands often, covering your mouth when you cough or sneeze, and avoiding public places where you could spread your illness to others.   You may return to normal activities (work/school) when:  - You are having improvement in symptoms  - AND have had resolution of fever without the use of fever-reducing medications for 24 hours  Follow-up instructions: Please follow-up with your primary care provider for further evaluation of your symptoms if you are not feeling better within the next 5 days.  Return instructions:  Please return to the Emergency Department if you experience worsening  symptoms.  RETURN IMMEDIATELY IF you develop shortness of breath, confusion or altered mental status, a new rash, become dizzy, faint, or poorly responsive, or are unable to be cared for at home. Please return if you have persistent vomiting and cannot keep down fluids or develop a fever that is not controlled by tylenol or motrin.   Please return if you have any other emergent concerns.

## 2023-02-17 NOTE — ED Triage Notes (Signed)
Patient c/o flu like symtoms x 2 days. Patient report taking tylenol for on and off fever. Patient report N/V.

## 2023-06-14 ENCOUNTER — Ambulatory Visit
Admission: RE | Admit: 2023-06-14 | Discharge: 2023-06-14 | Disposition: A | Discharge status: Home / Self Care | Payer: Medicaid Other | Source: Ambulatory Visit | Attending: Internal Medicine | Admitting: Internal Medicine

## 2023-06-14 DIAGNOSIS — E2839 Other primary ovarian failure: Secondary | ICD-10-CM

## 2023-09-06 ENCOUNTER — Emergency Department (HOSPITAL_COMMUNITY)
Admission: EM | Admit: 2023-09-06 | Discharge: 2023-09-06 | Disposition: A | Discharge status: Home / Self Care | Attending: Emergency Medicine | Admitting: Emergency Medicine

## 2023-09-06 ENCOUNTER — Encounter (HOSPITAL_COMMUNITY): Payer: Self-pay

## 2023-09-06 ENCOUNTER — Other Ambulatory Visit: Payer: Self-pay

## 2023-09-06 ENCOUNTER — Emergency Department (HOSPITAL_COMMUNITY)

## 2023-09-06 DIAGNOSIS — R0789 Other chest pain: Secondary | ICD-10-CM | POA: Insufficient documentation

## 2023-09-06 DIAGNOSIS — Z79899 Other long term (current) drug therapy: Secondary | ICD-10-CM | POA: Insufficient documentation

## 2023-09-06 DIAGNOSIS — Z794 Long term (current) use of insulin: Secondary | ICD-10-CM | POA: Insufficient documentation

## 2023-09-06 DIAGNOSIS — E119 Type 2 diabetes mellitus without complications: Secondary | ICD-10-CM | POA: Insufficient documentation

## 2023-09-06 DIAGNOSIS — I10 Essential (primary) hypertension: Secondary | ICD-10-CM | POA: Insufficient documentation

## 2023-09-06 DIAGNOSIS — R079 Chest pain, unspecified: Secondary | ICD-10-CM

## 2023-09-06 LAB — BASIC METABOLIC PANEL WITH GFR
Anion gap: 9 (ref 5–15)
BUN: 13 mg/dL (ref 6–20)
CO2: 26 mmol/L (ref 22–32)
Calcium: 9.3 mg/dL (ref 8.9–10.3)
Chloride: 100 mmol/L (ref 98–111)
Creatinine, Ser: 0.66 mg/dL (ref 0.44–1.00)
GFR, Estimated: >60 mL/min (ref >60)
Glucose, Bld: 236 mg/dL — ABNORMAL HIGH (ref 70–99)
Potassium: 4.2 mmol/L (ref 3.5–5.1)
Sodium: 135 mmol/L (ref 135–145)

## 2023-09-06 LAB — TROPONIN I (HIGH SENSITIVITY)
Troponin I (High Sensitivity): 3 ng/L (ref <18)
Troponin I (High Sensitivity): 3 ng/L (ref <18)

## 2023-09-06 LAB — CBC
HCT: 37.3 % (ref 36.0–46.0)
Hemoglobin: 11.7 g/dL — ABNORMAL LOW (ref 12.0–15.0)
MCH: 26.3 pg (ref 26.0–34.0)
MCHC: 31.4 g/dL (ref 30.0–36.0)
MCV: 83.8 fL (ref 80.0–100.0)
Platelets: 297 K/uL (ref 150–400)
RBC: 4.45 MIL/uL (ref 3.87–5.11)
RDW: 12.8 % (ref 11.5–15.5)
WBC: 13.6 K/uL — ABNORMAL HIGH (ref 4.0–10.5)
nRBC: 0 % (ref 0.0–0.2)

## 2023-09-06 LAB — D-DIMER, QUANTITATIVE: D-Dimer, Quant: 0.49 ug{FEU}/mL (ref 0.00–0.50)

## 2023-09-06 LAB — HCG, SERUM, QUALITATIVE: Preg, Serum: NEGATIVE

## 2023-09-06 MED ORDER — IOHEXOL 350 MG/ML SOLN
100.0000 mL | Freq: Once | INTRAVENOUS | Status: AC | PRN
  Administered 2023-09-06 (×2): 100 mL via INTRAVENOUS

## 2023-09-06 MED ORDER — NITROGLYCERIN 0.4 MG SL SUBL: 0.4000 mg | SUBLINGUAL_TABLET | SUBLINGUAL | 0 refills | Status: AC | PRN

## 2023-09-06 MED ORDER — NITROGLYCERIN 0.4 MG SL SUBL
0.4000 mg | SUBLINGUAL_TABLET | SUBLINGUAL | Status: DC | PRN
  Administered 2023-09-06 (×8): 0.4 mg via SUBLINGUAL
  Filled 2023-09-06 (×2): qty 1

## 2023-09-06 NOTE — ED Triage Notes (Signed)
 Patient said she has had left sided chest pain since 430pm yesterday. No radiation. Stated her left hand feels numb, she cannot tie her shoes. Feels short of breath and said it hurts to breathe. Has sharp pains in the top of her back.

## 2023-09-06 NOTE — Discharge Instructions (Addendum)
 Your chest pain workup today was overall reassuring. We have placed a referral for you to see Cardiology for further workup. Please see below for their contact information. You may take the nitroglycerin  tablets as needed for pain relief. Please also try lidocaine  patches or heating pads for symptom relief.   Your imaging also incidentally showed some growths on your ovaries and cervix. Please plan to see your OBGYN provider as soon as possible for follow up.   Please return to the ED if you develop any of the following: -Worsening chest pain  -Difficulty breathing  -Nausea/vomiting -Sweating -Fainting

## 2023-09-06 NOTE — ED Provider Notes (Signed)
 [place] EMERGENCY DEPARTMENT AT [facility] Provider Note   CSN: [id number] Arrival date & time: 09/06/23  9340   Patient presents with: Chest Pain  Melanie Luna is a 48 y.o. female.  -Left sided chest and back pain since 430pm yesterday -Describes chest heaviness -No radiation of pain down arms -Hurts to take deep breaths -Hands felt numb  -Couple of coughs yesterday, nonproductive -No fevers -No N/V/D -No extremity swelling   Pertinent PMH:  HTN T2DM      Prior to Admission medications   Medication Sig Start Date End Date Taking? Authorizing Provider  nitroGLYCERIN  (NITROSTAT ) 0.4 MG SL tablet Place 1 tablet (0.4 mg total) under the tongue every 5 (five) minutes as needed for chest pain. 09/06/23  Yes Diona Perkins, MD  amLODipine (NORVASC) 5 MG tablet Take 5 mg by mouth daily. 08/22/17   [provider]  ciclopirox  (PENLAC ) 8 % solution Apply topically at bedtime. Apply over nail and surrounding skin. Apply daily over previous coat. Remove weekly with polish remover. Patient taking differently: Apply 1 application topically at bedtime. Apply over nail and surrounding skin. (Feet) 12/02/17   Gaynel Delon CROME, DPM  clotrimazole  (LOTRIMIN ) 1 % cream Apply to both feet and between toes twice daily Patient not taking: Reported on 05/18/2019 06/02/18   Gaynel Delon CROME, DPM  diazepam (VALIUM) 5 MG tablet TAKE 1 TABLET BY MOUTH 1 HOUR BEFORE FLYING AS NEEDED FOR ANXIETY. 02/14/18   [provider]  diclofenac (VOLTAREN) 75 MG EC tablet Take 75 mg by mouth 2 (two) times daily as needed for mild pain (muscle pain).  11/22/17   [provider]  docusate sodium (COLACE) 100 MG capsule TAKE 2 CAPSULES BY MOUTH EVERY DAY AS NEEDED 05/21/18   [provider]  famotidine  (PEPCID ) 20 MG tablet Take 1 tablet (20 mg total) by mouth 2 (two) times daily. 09/09/17   Theadore Ozell HERO, MD  fluconazole  (DIFLUCAN ) 150 MG tablet TAKE 1 TABLET BY  MOUTH EVERY WEEK AS NEEDED. 04/17/18   [provider]  ibuprofen  (ADVIL ,MOTRIN ) 600 MG tablet Take 600 mg by mouth every 6 (six) hours as needed for mild pain or headache. 11/10/17   [provider]  insulin  aspart (NOVOLOG  FLEXPEN) 100 UNIT/ML FlexPen Inject 15 Units into the skin 3 (three) times daily with meals. 03/04/16   Devora Perkins, PA-C  Insulin  Degludec (TRESIBA  FLEXTOUCH) 200 UNIT/ML SOPN Inject 10 Units into the skin 2 (two) times daily. Patient taking differently: Inject 50 Units into the skin at bedtime.  03/04/16   Devora Perkins, PA-C  lisinopril  (PRINIVIL ,ZESTRIL ) 40 MG tablet Take 40 mg by mouth daily.    [provider]  losartan (COZAAR) 100 MG tablet Take 100 mg by mouth daily. 11/13/17   [provider]  ondansetron  (ZOFRAN ) 4 MG tablet Take 1 tablet (4 mg total) by mouth every 8 (eight) hours as needed for nausea or vomiting. 01/19/18   Mesner, Jason, MD  phentermine (ADIPEX-P) 37.5 MG tablet Take 37.5 mg by mouth every morning. 11/22/17   [provider]    Allergies: Codeine and Penicillins    Review of Systems  Constitutional:  Negative for fever.  Respiratory:  Positive for cough.   Cardiovascular:  Positive for chest pain.  Gastrointestinal:  Negative for diarrhea, nausea and vomiting.    Updated Vital Signs BP (!) 147/79   Pulse 80   Temp 97.7 F (36.5 C) (Oral)   Resp 15   Ht 5'  4 (1.626 m)   Wt 81.6 kg   SpO2 100%   BMI 30.90 kg/m   Physical Exam Constitutional:      General: She is not in acute distress. Cardiovascular:     Rate and Rhythm: Normal rate and regular rhythm.     Heart sounds: Normal heart sounds.  Pulmonary:     Effort: Pulmonary effort is normal.     Breath sounds: Normal breath sounds.  Chest:     Chest wall: Tenderness present. No deformity or crepitus.  Abdominal:     Palpations: Abdomen is soft.     Tenderness: There is no abdominal tenderness.  Musculoskeletal:     Right lower leg:  No edema.     Left lower leg: No edema.  Skin:    General: Skin is warm and dry.  Neurological:     Mental Status: She is alert.     Comments: Normal strength and sensation of BUE.      (all labs ordered are listed, but only abnormal results are displayed) Labs Reviewed  BASIC METABOLIC PANEL WITH GFR - Abnormal; Notable for the following components:      Result Value   Glucose, Bld 236 (*)    All other components within normal limits  CBC - Abnormal; Notable for the following components:   WBC 13.6 (*)    Hemoglobin 11.7 (*)    All other components within normal limits  HCG, SERUM, QUALITATIVE  D-DIMER, QUANTITATIVE  CA 125  TROPONIN I (HIGH SENSITIVITY)  TROPONIN I (HIGH SENSITIVITY)    EKG: EKG Interpretation Date/Time:  Tuesday September 06 2023 07:18:16 EDT Ventricular Rate:  86 PR Interval:  143 QRS Duration:  74 QT Interval:  362 QTC Calculation: 433 R Axis:   7  Text Interpretation: Sinus rhythm Low voltage, precordial leads No significant change since last tracing Confirmed by Dreama Longs ([id number]) on 09/06/2023 9:02:00 AM  Radiology: CT Angio Chest/Abd/Pel for Dissection W and/or Wo Contrast Result Date: 09/06/2023 CLINICAL DATA:  Acute aortic syndrome (AAS) suspected. Chest discomfort. * Tracking Code: BO * EXAM: CT ANGIOGRAPHY CHEST, ABDOMEN AND PELVIS TECHNIQUE: Non-contrast CT of the chest was initially obtained. Multidetector CT imaging through the chest, abdomen and pelvis was performed using the standard protocol during bolus administration of intravenous contrast. Multiplanar reconstructed images and MIPs were obtained and reviewed to evaluate the vascular anatomy. RADIATION DOSE REDUCTION: This exam was performed according to the departmental dose-optimization program which includes automated exposure control, adjustment of the mA and/or kV according to patient size and/or use of iterative reconstruction technique. CONTRAST:  OMNIPAQUE  IOHEXOL  350  MG/ML SOLN COMPARISON:  CT scan abdomen and pelvis from 12/07/2013. FINDINGS: CTA CHEST FINDINGS Cardiovascular: No intramural hematoma noted in the thoracic aorta on the unenhanced images. Thoracic aorta is normal in caliber without aneurysm, dissection, vasculitis or significant stenosis. Normal cardiac size. No pericardial effusion. Mediastinum/Nodes: Visualized thyroid  gland appears grossly unremarkable. No solid / cystic mediastinal masses. The esophagus is nondistended precluding optimal assessment. No axillary, mediastinal or hilar lymphadenopathy by size criteria. Lungs/Pleura: The central tracheo-bronchial tree is patent. There is mild, smooth, circumferential thickening of the segmental and subsegmental bronchial walls, throughout bilateral lungs, which is nonspecific. Findings are most commonly seen with bronchitis or reactive airway disease, such as asthma. There are patchy areas of linear, plate-like atelectasis and/or scarring throughout bilateral lungs. No mass or consolidation. No pleural effusion or pneumothorax. There is a 4 x 5 mm subpleural, central cavitary noncalcified nodule in  the right lung lower lobe (series 16, image 64). Please see follow-up recommendations below. Musculoskeletal: The visualized soft tissues of the chest wall are grossly unremarkable. No suspicious osseous lesions. Review of the MIP images confirms the above findings. CTA ABDOMEN AND PELVIS FINDINGS VASCULAR Aorta: Normal caliber aorta without aneurysm, dissection, vasculitis or significant stenosis. Celiac: Patent without evidence of aneurysm, dissection, vasculitis or significant stenosis. SMA: Patent without evidence of aneurysm, dissection, vasculitis or significant stenosis. Renals: Both renal arteries are patent without evidence of aneurysm, dissection, vasculitis, fibromuscular dysplasia or significant stenosis. IMA: Patent without evidence of aneurysm, dissection, vasculitis or significant stenosis. Inflow: There  is moderate to severe narrowing at the origin of right internal iliac artery due to mixed calcified and noncalcified plaque. Otherwise patent without evidence of aneurysm, dissection, vasculitis or significant stenosis. Veins: No obvious venous abnormality within the limitations of this arterial phase study. Review of the MIP images confirms the above findings. NON-VASCULAR Hepatobiliary: The liver is normal in size. Non-cirrhotic configuration. No suspicious mass. No intrahepatic or extrahepatic bile duct dilation. No calcified gallstones. Normal gallbladder wall thickness. No pericholecystic inflammatory changes. Pancreas: Unremarkable. No pancreatic ductal dilatation or surrounding inflammatory changes. Spleen: Within normal limits. No focal lesion. Adrenals/Urinary Tract: Adrenal glands are unremarkable. No suspicious renal mass. No hydronephrosis. No renal or ureteric calculi. Unremarkable urinary bladder. Stomach/Bowel: There is a tiny sliding hiatal hernia. There is also a small diverticulum arising from the third part of duodenum. No disproportionate dilation of the small or large bowel loops. No evidence of abnormal bowel wall thickening or inflammatory changes. The appendix is unremarkable. Vascular/Lymphatic: No ascites or pneumoperitoneum. No abdominal or pelvic lymphadenopathy, by size criteria. Reproductive: Not well evaluated on the CT scan exam. Having said that in normal-sized anteverted uterus noted. There is an approximately 2.4 x 3.0 cm ill-defined hypoattenuating area in the region of cervix, which is not well characterized on the CT scan exam. This may which may represent cervical nabothian cyst however, this can be better evaluated with contrast-enhanced MRI pelvis. There are large mixed solid/cystic masses in the bilateral adnexa. Bilateral ovaries and are not separately seen and these are likely arising from the ovaries. The left adnexal mass measures approximately 5.8 x 7.6 cm and right  adnexal mass measures approximate 4.4 x 6.5 cm. These are new since the prior study from 2015. These are highly concerning for neoplastic process. Further evaluation with contrast-enhanced MRI pelvis is recommended. Also correlation with tumor markers and OBGYN consultation is recommended. Other: There is a tiny fat containing umbilical hernia. The soft tissues and abdominal wall are otherwise unremarkable. Musculoskeletal: No suspicious osseous lesions. Review of the MIP images confirms the above findings. IMPRESSION: 1. No acute aortic syndrome. No thoracic or abdominal aortic aneurysm, dissection or vasculitis. 2. There are large mixed solid/cystic masses in the bilateral adnexa, which are new since the prior study from 2015. These are highly concerning for neoplastic process. No ascites. Further evaluation with contrast-enhanced MRI pelvis is recommended. Also correlation with tumor markers and OBGYN consultation is recommended. 3. There is an approximately 2.4 x 3.0 cm ill-defined hypoattenuating area in the region of cervix, which is not well characterized on the CT scan exam. This may represent cervical nabothian cyst however, can be better evaluated with contrast-enhanced MRI pelvis. 4. There is a 4 x 5 mm subpleural, central cavitary noncalcified nodule in the right lung lower lobe. This is indeterminate. Consider short-term follow-up exam in 3-6 months to document stability. Consider earlier follow-up,  in case of neoplastic process. 5. Multiple other nonacute observations, as described above. Electronically Signed   By: Ree Molt M.D.   On: 09/06/2023 11:03   DG Chest 2 View Result Date: 09/06/2023 CLINICAL DATA:  Chest discomfort. EXAM: CHEST - 2 VIEW COMPARISON:  04/26/2011. FINDINGS: Cardiac silhouette is normal in size and configuration. Normal mediastinal and hilar contours. Clear lungs.  No pleural effusion or pneumothorax. Skeletal structures are unremarkable. IMPRESSION: No active  cardiopulmonary disease. Electronically Signed   By: Alm Parkins M.D.   On: 09/06/2023 08:47    Medications Ordered in the ED  nitroGLYCERIN  (NITROSTAT ) SL tablet 0.4 mg (0.4 mg Sublingual Given 09/06/23 1153)  iohexol  (OMNIPAQUE ) 350 MG/ML injection 100 mL (100 mLs Intravenous Contrast Given 09/06/23 1026)   Medical Decision Making Amount and/or Complexity of Data Reviewed Labs: ordered. Radiology: ordered.  Risk Prescription drug management.   Rashiya Hodaya Curto is a 48 y.o. female is here with chest pain.    Lab Tests:  CBC: WBC 13.6, Hgb 11.7 BMP: Glu 236 Troponin 3>3 D-dimer 0.49  Imaging Studies ordered:  CT Dissection: Negative for acute aortic findings. Incidentally found large mixed solid/cystic masses in the bilateral adnexa, hypoattenuating area in the region of cervix, 4 x 5 mm subpleural, central cavitary noncalcified nodule in the right lung lower lobe.   Medicines ordered:  Nitroglycerin  prn   Plan Workup reassuring against ACS, acute aortic syndrome, and PE. Overall presentation suspicious for MSK etiology vs atypical chest pain with some reported symptomatic relief from nitroglycerin . Discussed and placed referral to Cardiology for further workup. Plan for discharge with nitroglycerin  as needed in addition to supportive symptom relief measures. Discussed incidental CT findings and plan for patient to see primary OBGYN for follow up. CA-125 pending. Patient overall stable for discharge home.   Final diagnoses:  Chest pain, unspecified type    ED Discharge Orders          Ordered    Ambulatory referral to Cardiology  Status:  Canceled       Comments: Atypical chest pain   09/06/23 1326    Ambulatory referral to Cardiology        09/06/23 1354    nitroGLYCERIN  (NITROSTAT ) 0.4 MG SL tablet  Every 5 min PRN        09/06/23 1358               Diona Perkins, MD 09/06/23 1404    Dreama Longs, MD 09/06/23 2330

## 2023-09-07 LAB — CA 125: Cancer Antigen (CA) 125: 42.8 U/mL — ABNORMAL HIGH (ref 0.0–38.1)

## 2023-09-07 LAB — CEA: CEA: 2.4 ng/mL (ref 0.0–4.7)
